# Patient Record
Sex: Female | Born: 1970 | Race: Black or African American | Hispanic: No | Marital: Single | Smoking: Former smoker
Health system: Southern US, Community
[De-identification: ages and names within clinical notes are randomized; demographics above are authoritative.]

## PROBLEM LIST (undated history)

## (undated) DIAGNOSIS — B9689 Other specified bacterial agents as the cause of diseases classified elsewhere: Secondary | ICD-10-CM

## (undated) DIAGNOSIS — R198 Other specified symptoms and signs involving the digestive system and abdomen: Secondary | ICD-10-CM

## (undated) DIAGNOSIS — N898 Other specified noninflammatory disorders of vagina: Principal | ICD-10-CM

## (undated) DIAGNOSIS — N760 Acute vaginitis: Secondary | ICD-10-CM

## (undated) DIAGNOSIS — N816 Rectocele: Secondary | ICD-10-CM

## (undated) HISTORY — DX: Other specified symptoms and signs involving the digestive system and abdomen: R19.8

## (undated) HISTORY — PX: ABDOMINAL HYSTERECTOMY: SHX81

## (undated) HISTORY — DX: Acute vaginitis: N76.0

## (undated) HISTORY — DX: Other specified noninflammatory disorders of vagina: N89.8

## (undated) HISTORY — DX: Rectocele: N81.6

## (undated) HISTORY — PX: TUBAL LIGATION: SHX77

## (undated) HISTORY — DX: Other specified bacterial agents as the cause of diseases classified elsewhere: B96.89

---

## 2001-12-12 ENCOUNTER — Emergency Department (HOSPITAL_COMMUNITY): Admission: EM | Admit: 2001-12-12 | Discharge: 2001-12-13 | Payer: Self-pay | Admitting: Emergency Medicine

## 2002-03-01 ENCOUNTER — Other Ambulatory Visit: Admission: RE | Admit: 2002-03-01 | Discharge: 2002-03-01 | Payer: Self-pay | Admitting: Obstetrics & Gynecology

## 2002-03-24 ENCOUNTER — Emergency Department (HOSPITAL_COMMUNITY): Admission: EM | Admit: 2002-03-24 | Discharge: 2002-03-24 | Payer: Self-pay | Admitting: Internal Medicine

## 2003-01-28 ENCOUNTER — Emergency Department (HOSPITAL_COMMUNITY): Admission: EM | Admit: 2003-01-28 | Discharge: 2003-01-29 | Payer: Self-pay | Admitting: Emergency Medicine

## 2003-01-29 ENCOUNTER — Encounter: Payer: Self-pay | Admitting: *Deleted

## 2004-04-30 ENCOUNTER — Ambulatory Visit (HOSPITAL_COMMUNITY): Admission: RE | Admit: 2004-04-30 | Discharge: 2004-04-30 | Payer: Self-pay | Admitting: Pediatrics

## 2004-05-12 ENCOUNTER — Emergency Department (HOSPITAL_COMMUNITY): Admission: EM | Admit: 2004-05-12 | Discharge: 2004-05-13 | Payer: Self-pay | Admitting: Emergency Medicine

## 2004-06-12 ENCOUNTER — Inpatient Hospital Stay (HOSPITAL_COMMUNITY): Admission: RE | Admit: 2004-06-12 | Discharge: 2004-06-14 | Payer: Self-pay | Admitting: Obstetrics & Gynecology

## 2004-07-13 ENCOUNTER — Inpatient Hospital Stay (HOSPITAL_COMMUNITY): Admission: AD | Admit: 2004-07-13 | Discharge: 2004-07-17 | Payer: Self-pay | Admitting: Obstetrics and Gynecology

## 2004-08-15 ENCOUNTER — Ambulatory Visit (HOSPITAL_COMMUNITY): Admission: RE | Admit: 2004-08-15 | Discharge: 2004-08-15 | Payer: Self-pay | Admitting: Obstetrics & Gynecology

## 2005-02-27 ENCOUNTER — Ambulatory Visit (HOSPITAL_COMMUNITY): Admission: RE | Admit: 2005-02-27 | Discharge: 2005-02-27 | Payer: Self-pay | Admitting: Family Medicine

## 2005-03-13 ENCOUNTER — Ambulatory Visit (HOSPITAL_COMMUNITY): Admission: RE | Admit: 2005-03-13 | Discharge: 2005-03-13 | Payer: Self-pay | Admitting: Family Medicine

## 2005-04-06 ENCOUNTER — Emergency Department (HOSPITAL_COMMUNITY): Admission: EM | Admit: 2005-04-06 | Discharge: 2005-04-07 | Payer: Self-pay | Admitting: Emergency Medicine

## 2006-08-18 ENCOUNTER — Ambulatory Visit (HOSPITAL_COMMUNITY): Admission: RE | Admit: 2006-08-18 | Discharge: 2006-08-18 | Payer: Self-pay | Admitting: Obstetrics and Gynecology

## 2008-08-05 ENCOUNTER — Ambulatory Visit (HOSPITAL_COMMUNITY): Admission: RE | Admit: 2008-08-05 | Discharge: 2008-08-05 | Payer: Self-pay | Admitting: Family Medicine

## 2008-10-11 ENCOUNTER — Emergency Department (HOSPITAL_COMMUNITY): Admission: EM | Admit: 2008-10-11 | Discharge: 2008-10-12 | Payer: Self-pay | Admitting: Emergency Medicine

## 2010-08-06 ENCOUNTER — Emergency Department (HOSPITAL_COMMUNITY): Admission: EM | Admit: 2010-08-06 | Discharge: 2010-08-06 | Payer: Self-pay | Admitting: Emergency Medicine

## 2011-01-25 ENCOUNTER — Emergency Department (HOSPITAL_COMMUNITY): Payer: Self-pay

## 2011-01-25 ENCOUNTER — Emergency Department (HOSPITAL_COMMUNITY)
Admission: EM | Admit: 2011-01-25 | Discharge: 2011-01-25 | Disposition: A | Discharge status: Home / Self Care | Payer: Self-pay | Attending: Emergency Medicine | Admitting: Emergency Medicine

## 2011-01-25 DIAGNOSIS — R0789 Other chest pain: Secondary | ICD-10-CM | POA: Insufficient documentation

## 2011-01-25 DIAGNOSIS — R059 Cough, unspecified: Secondary | ICD-10-CM | POA: Insufficient documentation

## 2011-01-25 DIAGNOSIS — R0602 Shortness of breath: Secondary | ICD-10-CM | POA: Insufficient documentation

## 2011-01-25 DIAGNOSIS — R05 Cough: Secondary | ICD-10-CM | POA: Insufficient documentation

## 2011-01-25 DIAGNOSIS — J45909 Unspecified asthma, uncomplicated: Secondary | ICD-10-CM | POA: Insufficient documentation

## 2011-01-25 DIAGNOSIS — K219 Gastro-esophageal reflux disease without esophagitis: Secondary | ICD-10-CM | POA: Insufficient documentation

## 2011-04-26 NOTE — Discharge Summary (Signed)
NAME:  Stacy Holder, Stacy Holder                       ACCOUNT NO.:  [id number]   MEDICAL RECORD NO.:  [id number]                   PATIENT TYPE:  INP   LOCATION:  A419                                 FACILITY:  [facility]   PHYSICIAN:  Tilda Burrow, M.D.              DATE OF BIRTH:  Jul 05, 1971   DATE OF ADMISSION:  07/13/2004  DATE OF DISCHARGE:  07/17/2004                                 DISCHARGE SUMMARY   ADMISSION DIAGNOSES:  Pelvic pain, pelvic fullness, abscess versus ovarian  cyst.   DISCHARGE DIAGNOSES:  Entrapped ovary with pelvic adhesions, pelvic pain.   PROCEDURE:  Laparoscopic bilateral salpingo-oophorectomy.   SURGEON:  Tilda Burrow, M.D.   ASSISTANT:  ___________.   FINDINGS:  Extensive adhesions of the left side wall, left ovarian trapped  at the vaginal cuff and beside sigmoid, small bowel attached to right round  ligament.   DETAILS OF THE PROCEDURE:  The patient was admitted for pain management on  July 13, 2004 as described in the HPI. She had debilitating pain and a mass  effect in the cul-de-sac. Sed rate was completely normal at 4, white count  10,400, 69 neutrophils, no bands. The patient was admitted for pain  management and assessment.   The patient was admitted, received IV analgesics, and had pelvic ultrasound  performed. We discussed options of surgery, ovarian suppression, and  continued observation. The patient was aware that left salpingo-oophorectomy  was likely and right salpingo-oophorectomy a possibility. The patient had  had bowel prep performed, and surgery was scheduled for July 15, 2004.  Laparoscopic bilateral salpingo-oophorectomy was performed. The procedure  was notable for cuff separation during manipulation of the vaginal cuff that  did not require resuturing. There was 25 cc of blood loss. Findings included  small bowel attached to the right round ligament coagulation site and then  extensive adhesions to left side wall and left  ovary trapped behind the  vaginal cuff and side the sigmoid. The right tube and ovary were  erythematous, edematous, and with enough adhesions that we felt the chances  of return surgery were very, very, very high and therefore as discussed  preoperatively both tubes and ovaries were removed. Postoperatively, the  patient was kept overnight for IV analgesia and was discharged home.  Pathology report returned showing thickened structures but essentially no  other pathology noted.   ADDENDUM:  Postoperatively, the patient remained somewhat tearful and  depressed due to her concern over loosing both tubes and ovaries. We began  hormone replacement therapy and will see her in followup. She will require  management of her hormones as well as potential for depression.    ___________________________________________                                         Tilda Burrow, M.D.  JVF/MEDQ  D:  08/02/2004  T:  08/03/2004  Job:  [id number]

## 2011-04-26 NOTE — Op Note (Signed)
NAME:  Stacy Holder, Stacy Holder                       ACCOUNT NO.:  [id number]   MEDICAL RECORD NO.:  [id number]                   PATIENT TYPE:  AMB   LOCATION:  DAY                                  FACILITY:  [facility]   PHYSICIAN:  Lazaro Arms, M.D.                DATE OF BIRTH:  12/23/1970   DATE OF PROCEDURE:  06/12/2004  DATE OF DISCHARGE:                                 OPERATIVE REPORT   PREOPERATIVE DIAGNOSES:  1. Menometrorrhagia, severe.  2. Dysmenorrhea, severe.  3. Unresponsive to conservative measures.  4. Enlarged uterus consistent with fibroids, 10 weeks size.   POSTOPERATIVE DIAGNOSES:  1. Menometrorrhagia, severe.  2. Dysmenorrhea, severe.  3. Unresponsive to conservative measures.  4. Enlarged uterus consistent with fibroids, 10 weeks size.   OPERATION/PROCEDURE:  Abdominal hysterectomy.   SURGEON:  Lazaro Arms, M.D.   ANESTHESIA:  General endotracheal anesthesia.   FINDINGS:  The patient had a globally large uterus, not sure she had any  fibroids per se, probably adenomyosis.  The ovaries were normally intact.  There was no endometriosis.  There was no adhesive disease.  The pelvis was  entirely normal.   DESCRIPTION OF PROCEDURE:  The patient was taken to the operating room and  placed in the supine position where she underwent general endotracheal  anesthesia.  The vagina was prepped.  The patient Foley catheter was placed  and it was prepped and draped in the usual sterile fashion.  A Pfannenstiel  incision was made and carried down sharply to rectus fascia which was scored  in the midline and extended laterally.  The fascia was taken off the muscle  superiorly and entered without difficulty.  The muscles were divided.  The  abdominal cavity was entered.  A medium-size protractor was placed as a self-  retaining retractor.  The upper abdomen was packed away using moist lap  pads.  The fundus of the uterus was grasped with a thyroid tenaculum.  The  curved  clamped gyrus was then used for coagulation.  The utero-ovarian  ligaments were taken down bilaterally.  The round ligaments were taken down  bilaterally and cut.  The uterine vessels were taken down after the  vesicouterine serosa was created.  Uterine vessels were cut, clamped and  coagulated, and then cut bilaterally with good hemostasis.  The bladder was  placed well out of the way.  Dissection below the cervix.  The cardinal  ligament was taken down, was coagulated with the gyrus on both sides and  cut.  The vaginal angle on the left was clamped and the vagina was opened  and took the vagina anterior and posteriorly separately using the gyrus.  The vaginal angle sutures were placed bilaterally and the vagina was closed  with interrupted figure-of-eight sutures.  The specimen was sent to  pathology.  Blood loss for the procedure was 100 mL.  Vigorous pelvic  irrigation was performed.  All pedicles were found to be hemostatic.  Interseed was placed in the pelvis to prevent adhesions of small bowel,  large bowel and ovaries over the area of dissection and suture.  The  protractor was removed.  The packs were removed.  There was good hemostasis  again.  The muscles reapproximated loosely.  The fascia closed 0 Vicryl  running.  Subcutaneous tissue was made hemostatic and irrigated.  The skin  was closed with a 3-0 Vicryl in a subcuticular fashion using a Mellody Dance needle.  Dermabond was then placed over top.  The patient had requested no staples.  The patient was awakened from anesthesia and taken to the recovery room in  stable condition.      ___________________________________________                                            Lazaro Arms, M.D.   [name]  D:  06/12/2004  T:  06/12/2004  Job:  [id number]

## 2011-04-26 NOTE — Op Note (Signed)
NAME:  Stacy Holder, Stacy Holder                       ACCOUNT NO.:  [id number]   MEDICAL RECORD NO.:  [id number]                   PATIENT TYPE:  INP   LOCATION:  A419                                 FACILITY:  [facility]   PHYSICIAN:  Tilda Burrow, M.D.              DATE OF BIRTH:  [birth year]   DATE OF PROCEDURE:  DATE OF DISCHARGE:                                 OPERATIVE REPORT   PREOPERATIVE DIAGNOSIS:  Pelvic pain.   POSTOPERATIVE DIAGNOSIS:  Pelvic pain.  Multiple pelvic adhesions and  trapped left ovary.   PROCEDURE:  Laparoscopic bilateral salpingo-oophorectomies.   SURGEON:  Tilda Burrow, M.D.   ASSISTANTAmie Critchley, CST.   ANESTHESIA:  Julious Oka, CRNA.   COMPLICATIONS:  Spontaneous partial separation of the vaginal cuff during  vaginal cuff manipulation, not requiring suture repair.   INDICATIONS:  A 40 year old female one month status post hysterectomy, using  Gyrus bipolar cautery technique, returning for pelvic discomfort in the left  side noted significantly since the surgery with ultrasound confirmation of  right ovarian cyst and left ovary just above the vaginal cuff.   PROCEDURE:  Patient was taken to the operating room, prepped and draped in  the usual fashion for a combined abdominal vaginal procedure.  The Foley  catheter was placed.  Speculum exam of the vagina performed.  A single-tooth  tenaculum used to manipulate the vaginal cuff.  We simply rubbed the vaginal  apex with the single-tooth tenaculum in an effort to identify the cuff area  of thickness.  The cuff partially separated spontaneously in response to the  manipulation, and there was a small portion of necrotic tissue that could be  shelled out from the open cuff area.  We placed the cuff on the back portion  of the vaginal cuff.  We then proceeded with laparoscopic procedure.  An  infraumbilical 1 cm skin incision was made as well as a transverse  suprapubic 1.5 cm incision.  The Veress  needle was easily used to achieve  pneumoperitoneum, laparoscopic trocar introduced, a 10 mm trocar, and then  we identified the abdominal cavity with photo #1 showing the initial  abdominal findings with sigmoid colon and epiploic fat attachments to the  left half of the anterior pelvic brim.  On the right side, photo #2 shows  small bowel attachments to the tip of the right round ligament.  Photo #3  shows the separation of that attachment.  Photo #4 shows the steady peeling  of the sigmoid colon off of the pelvic brim with visibility down to the  vaginal cuff.  We then proceeded with continued dissection using  laparoscopic peanuts and grasper to use primarily as a blunt manipulator  except to grasp the pedicles that were safe.  We were able on photo #5 to  grasp the left tube, which was firmly adherent to the lateral side of the  sigmoid colon, and we were  able to peel the sigmoid colon off of the bowel  to be able to identify the infundibulopelvic ligament.  We then clamped with  the harmonic scalpel across the left infundibulopelvic ligament, being  careful to stay away from the side wall, thereby staying out of the area of  the ureter and were usually able to take off the left tube and ovary.  This  was then taken out through the 12 mm suprapubic port by pulling it up to the  port and then morcellating the ovary into an elongated strip.  Attention was  then directed to the right side.  Efforts were made to see if we could free  it up sufficiently.  The ovary could be left, but the under-side of the  ovary was quite abraded by the time it had been freed up and was felt the  potential for recurrent adhesions was extremely high.  We then removed the  right tube and ovary with harmonic scalpel technique without difficulty.  This specimen was also taken through the suprapubic port in a morcellated  strip fashion and sent to the lab for pathology.  Insufflation of the cuff  revealed that  there had been somewhat necrotic tissue from the vaginal cuff  transection.  At the end of the procedure, you could actually see one of the  tips of the single-tooth tenaculum with manipulation, but the opening was  small enough that there was considered no potential for herniation.  There  was no gas or fluid escape noted when the patient coughed.  Throughout the  case, we had checked and had no noticed any gas escape of her vagina either.  Patient then went to the recovery room in good condition after removal of  laparoscopic equipment, was followed by 0 Vicryl closure of the fascia at  the umbilical and suprapubic sites and then 4-0 Dexon closure of all three  puncture sites were performed.  Patient went to the recovery room and  remained stable.  She will be watched overnight.      ___________________________________________                                            Tilda Burrow, M.D.   JVF/MEDQ  D:  07/16/2004  T:  07/16/2004  Job:  [id number]

## 2011-04-26 NOTE — H&P (Signed)
NAME:  Stacy Holder, Stacy Holder                       ACCOUNT NO.:  [id number]   MEDICAL RECORD NO.:  [id number]                   PATIENT TYPE:  INP   LOCATION:  A419                                 FACILITY:  [facility]   PHYSICIAN:  Tilda Burrow, M.D.              DATE OF BIRTH:  1971-04-04   DATE OF ADMISSION:  07/13/2004  DATE OF DISCHARGE:                                HISTORY & PHYSICAL   ADMISSION DIAGNOSES:  1. Pelvic pain.  2. Pelvic fullness, abscess versus ovarian cyst.   HISTORY OF PRESENT ILLNESS:  This 40 year old female now is one month status  post hysterectomy for severe menometrorrhagia is readmitted for pain  management and assessment of fullness and mass effect of the vaginal cuff.  She had been seen in our office intermittently since surgery and has  complained of persistent discomfort that has actually gotten worse recently.  Examination in our office on July 13, 2004, for the second time in a week,  showed the patient to have severe debilitating pain due to mass effect in  the cul-de-sac, felt to be abscess or ovarian cyst.  The pain moved over  into the left on bimanual examination, so making the possibility of a left  ovarian attachments to the vaginal cuff highly likely.  The patient is  unable to tolerate the pain without continuous analgesic use, and is  describing as comfortable for the first time in quite some time when on IV  analgesics.   Laboratory data on admission showed a white count 10,400, with 69  neutrophils, no bands.  Sedimentation rate is completely normal at 4.  The  patient's pain is sufficient to cause nausea and require IV analgesics and  antiemetics.   PAST MEDICAL HISTORY:  Benign.   GYNECOLOGICAL HISTORY:  Severe menometrorrhagia causing the patient to miss  three to four days of work each month with the discomfort of menses.  She  did not respond to medications, decided to proceed with surgery.   PAST SURGICAL HISTORY:  1.  Laparoscopic tubal ligation years ago.  2. Vaginal delivery x2.   PHYSICAL EXAMINATION:  GENERAL:  A tearful, animated, slim, African-American  female.  Alert, active, moderately uncomfortable African-American female.  VITAL SIGNS:  Height is 5 feet 10 inches, weight 130 pounds.  HEENT:  Pupils equal, round, reactive to light.  Extraocular movements were  intact.  NECK:  Supple.  Trachea midline.  CHEST:  Clear to auscultation.  ABDOMEN:  Well-healed Pfannenstiel incision with only slight thickening of  the scar felt tolerable to the patient.  PELVIC:  External genitalia:  Normal female.  Vaginal examination:  Normal  secretions.  Vaginal length adequate.  Vaginal cuff significantly tender and  exquisitely tender to contact causing the pain to go upward and to the left.   LABORATORY DATA:  CT scan of the abdomen shows a cystic structure with left  tube and ovary and right tube  and ovary even with each other at the level  just above the vaginal cuff.  Interestingly, the patient does not have  significant pain on the right at this time, and the cyst is on the right.   IMPRESSION:  Pelvic pain, postoperatively, suspected vaginal cuff adhesions  to the left adnexal structures.   PLAN:  Since admission, we have had lengthy discussions with the patient  regarding treatment options.  We have made it very specifically clear to her  that the likelihood of adhesions to the left tube and ovary are quite high.  It was recommended that we be prepared for likely removal of left tube and  ovary.  The right tube and ovary will be inspected.  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  If,  however, preservation of tube and ovary can reasonably expected to be  successful without chronic pelvic discomfort, we will  attempt to preserve  the right tube and ovary.  Additionally, the patient is comfortable with the  possibility of the need to convert to a laparotomy procedure through the old  hysterectomy scar.     ___________________________________________                                         Tilda Burrow, M.D.   JVF/MEDQ  D:  07/15/2004  T:  07/15/2004  Job:  [id number]   cc:   Tamela Oddi

## 2011-09-10 LAB — CBC
HCT: 41.7
Hemoglobin: 14.1
MCHC: 33.8
Platelets: 236
RBC: 4.56

## 2011-09-10 LAB — POCT CARDIAC MARKERS
CKMB, poc: 1
Myoglobin, poc: 52.6
Troponin i, poc: <0.05
Troponin i, poc: <0.05

## 2011-09-10 LAB — D-DIMER, QUANTITATIVE: D-Dimer, Quant: 0.37

## 2011-09-10 LAB — DIFFERENTIAL
Basophils Absolute: 0
Eosinophils Absolute: 0.1
Lymphs Abs: 5 — ABNORMAL HIGH
Neutrophils Relative %: 50

## 2011-09-10 LAB — BASIC METABOLIC PANEL
CO2: 29
GFR calc Af Amer: >60
GFR calc non Af Amer: >60

## 2013-07-22 ENCOUNTER — Emergency Department (HOSPITAL_COMMUNITY)
Admission: EM | Admit: 2013-07-22 | Discharge: 2013-07-22 | Disposition: A | Discharge status: Home / Self Care | Payer: Self-pay | Attending: Emergency Medicine | Admitting: Emergency Medicine

## 2013-07-22 ENCOUNTER — Encounter (HOSPITAL_COMMUNITY): Payer: Self-pay | Admitting: Emergency Medicine

## 2013-07-22 DIAGNOSIS — H9190 Unspecified hearing loss, unspecified ear: Secondary | ICD-10-CM | POA: Insufficient documentation

## 2013-07-22 DIAGNOSIS — H6690 Otitis media, unspecified, unspecified ear: Secondary | ICD-10-CM | POA: Insufficient documentation

## 2013-07-22 DIAGNOSIS — H9210 Otorrhea, unspecified ear: Secondary | ICD-10-CM | POA: Insufficient documentation

## 2013-07-22 DIAGNOSIS — F17200 Nicotine dependence, unspecified, uncomplicated: Secondary | ICD-10-CM | POA: Insufficient documentation

## 2013-07-22 DIAGNOSIS — H6120 Impacted cerumen, unspecified ear: Secondary | ICD-10-CM | POA: Insufficient documentation

## 2013-07-22 DIAGNOSIS — H6121 Impacted cerumen, right ear: Secondary | ICD-10-CM

## 2013-07-22 DIAGNOSIS — H6691 Otitis media, unspecified, right ear: Secondary | ICD-10-CM

## 2013-07-22 DIAGNOSIS — Z91040 Latex allergy status: Secondary | ICD-10-CM | POA: Insufficient documentation

## 2013-07-22 MED ORDER — AMOXICILLIN 500 MG PO CAPS: 500.0000 mg | ORAL_CAPSULE | Freq: Three times a day (TID) | ORAL | Status: DC

## 2013-07-22 MED ORDER — ANTIPYRINE-BENZOCAINE 5.4-1.4 % OT SOLN
3.0000 [drp] | Freq: Once | OTIC | Status: AC
  Administered 2013-07-22: 4 [drp] via OTIC
  Filled 2013-07-22: qty 10

## 2013-07-22 MED ORDER — HYDROCODONE-ACETAMINOPHEN 5-325 MG PO TABS
1.0000 | ORAL_TABLET | Freq: Once | ORAL | Status: AC
  Administered 2013-07-22: 1 via ORAL
  Filled 2013-07-22: qty 1

## 2013-07-22 NOTE — ED Notes (Addendum)
Patient c/o right ear pain with yellowish, brown drainage. Patient reports using neomycin/ polymycin drops with no relief. 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 Patient tearful in pain. Patient now reports sore throat.

## 2013-07-22 NOTE — Progress Notes (Signed)
ED/CM noted pt did not have health insurance, and/or PCP. Patient was given the ED [place] uninsured handout with information for the clinics, food pantries, and the handout for insurance sign-up. Patient expressed appreciation for this.      

## 2013-07-22 NOTE — ED Notes (Signed)
Lt ear pain, crying,  Irrigated with removal of wax .  Pt says she feels better after irrigation.

## 2013-07-23 NOTE — ED Provider Notes (Signed)
CSN: [id number]     Arrival date & time 07/22/13  1233 History     First MD Initiated Contact with Patient 07/22/13 1251     Chief Complaint  Patient presents with  . Otalgia   (Consider location/radiation/quality/duration/timing/severity/associated sxs/prior Treatment) Patient is a 42 y.o. female presenting with ear pain. The history is provided by the patient.  Otalgia Location:  Right Behind ear:  No abnormality Quality:  Aching and throbbing Severity:  Severe Onset quality:  Sudden Duration:  5 hours Timing:  Constant Progression:  Unchanged Chronicity:  Recurrent Context: water   Context: not direct blow, not elevation change, not foreign body in ear and not loud noise   Relieved by:  Nothing Worsened by:  Nothing tried Ineffective treatments: cortisporin drops. 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 No pertinent past medical history. Past Surgical History  Procedure Laterality Date  . Abdominal hysterectomy     History reviewed. No pertinent family history. History  Substance Use Topics  . Smoking status: Current Every Day Smoker -- 0.04 packs/day for 15 years    Types: Cigarettes  . Smokeless tobacco: Never Used  . Alcohol Use: No   OB History   Grav Para Term Preterm Abortions TAB SAB Ect Mult Living                 Review of Systems  Constitutional: Negative for fever, chills, activity change and appetite change.  HENT: Positive for ear pain and ear discharge. Negative for hearing loss, congestion, sore throat, facial swelling, rhinorrhea, trouble swallowing, neck pain, neck stiffness and tinnitus.   Respiratory: Negative for cough.   Gastrointestinal: Negative for vomiting, abdominal pain  and diarrhea.  Skin: Negative for rash.  Neurological: Negative for dizziness, syncope, weakness, numbness and headaches.  All other systems reviewed and are negative.    Allergies  Latex  Home Medications   Current Outpatient Rx  Name  Route  Sig  Dispense  Refill  . Calcium-Phosphorus-Vitamin D (CALCIUM GUMMIES PO)   Oral   Take 1 tablet by mouth daily.         Marland Kitchen estradiol (ESTRACE) 2 MG tablet   Oral   Take 2 mg by mouth daily.         . Multiple Vitamins-Minerals (CENTRUM PO)   Oral   Take 1 tablet by mouth daily.         Marland Kitchen amoxicillin (AMOXIL) 500 MG capsule   Oral   Take 1 capsule (500 mg total) by mouth 3 (three) times daily. For 10 days   30 capsule   0    BP 150/109  Pulse 60  Temp(Src) 98 F (36.7 C) (Oral)  Resp 26  Ht 5\' 3"  (1.6 m)  Wt 163 lb (73.936 kg)  BMI 28.88 kg/m2  SpO2 100% Physical Exam  Nursing note and vitals reviewed. Constitutional: She is oriented to person, place, and time. She appears well-developed and well-nourished. No distress.  HENT:  Head: Normocephalic and atraumatic.  Right Ear: There is drainage and tenderness. No mastoid tenderness. No hemotympanum. Decreased hearing is noted.  Left Ear: Tympanic membrane and ear canal normal.  Mouth/Throat: Oropharynx is clear and moist. No oropharyngeal exudate.  Cerumen impaction right ear canal.  TM not visualized due to the cerumen  Neck: Normal range of motion. Neck supple.  Cardiovascular: Normal rate, regular rhythm, normal heart sounds and intact distal pulses.   No murmur heard. Pulmonary/Chest: Effort normal and breath sounds normal. No respiratory distress.  Musculoskeletal: Normal range of motion.  Lymphadenopathy:    She has no cervical adenopathy.  Neurological: She is alert and oriented to person, place, and time. She exhibits normal muscle tone. Coordination normal.  Skin: Skin is warm and dry.    ED Course   Procedures (including critical care time)  Labs  Reviewed - No data to display No results found. 1. Cerumen impaction, right   2. Otitis media, right     MDM   Significant amt of cerumen irrigated from the right ear canal by the nursing staff using warm saline.  Pain improved, hearing also improved.  VSS.  Pt ambulates with a steady gait, appears stable for d/c  On recheck after irrigation the TM was visualized and TM was slightly erythematous w/o perforation.  Auralgan otic dispensed, prescribed amoxil and she agrees to f/u with her PMD  Haifa Hatton L. Daliya Parchment, PA-C 07/23/13 2050

## 2013-07-25 NOTE — ED Provider Notes (Signed)
Medical screening examination/treatment/procedure(s) were performed by non-physician practitioner and as supervising physician I was immediately available for consultation/collaboration.    Isaak Delmundo D Jaculin Rasmus, MD 07/25/13 0233 

## 2014-05-03 ENCOUNTER — Ambulatory Visit: Payer: Managed Care, Other (non HMO) | Admitting: Orthopedic Surgery

## 2014-07-04 ENCOUNTER — Emergency Department (HOSPITAL_COMMUNITY)
Admission: EM | Admit: 2014-07-04 | Discharge: 2014-07-04 | Disposition: A | Discharge status: Home / Self Care | Payer: Managed Care, Other (non HMO) | Attending: Emergency Medicine | Admitting: Emergency Medicine

## 2014-07-04 ENCOUNTER — Encounter (HOSPITAL_COMMUNITY): Payer: Self-pay | Admitting: Emergency Medicine

## 2014-07-04 DIAGNOSIS — Z792 Long term (current) use of antibiotics: Secondary | ICD-10-CM | POA: Insufficient documentation

## 2014-07-04 DIAGNOSIS — K089 Disorder of teeth and supporting structures, unspecified: Secondary | ICD-10-CM | POA: Insufficient documentation

## 2014-07-04 DIAGNOSIS — K0889 Other specified disorders of teeth and supporting structures: Secondary | ICD-10-CM

## 2014-07-04 DIAGNOSIS — Z79899 Other long term (current) drug therapy: Secondary | ICD-10-CM | POA: Insufficient documentation

## 2014-07-04 DIAGNOSIS — F17200 Nicotine dependence, unspecified, uncomplicated: Secondary | ICD-10-CM | POA: Insufficient documentation

## 2014-07-04 DIAGNOSIS — K029 Dental caries, unspecified: Secondary | ICD-10-CM | POA: Insufficient documentation

## 2014-07-04 DIAGNOSIS — Z91040 Latex allergy status: Secondary | ICD-10-CM | POA: Insufficient documentation

## 2014-07-04 MED ORDER — CLINDAMYCIN HCL 150 MG PO CAPS
300.0000 mg | ORAL_CAPSULE | Freq: Once | ORAL | Status: AC
  Administered 2014-07-04: 300 mg via ORAL
  Filled 2014-07-04: qty 2

## 2014-07-04 MED ORDER — CLINDAMYCIN HCL 300 MG PO CAPS: 300.0000 mg | ORAL_CAPSULE | Freq: Four times a day (QID) | ORAL | Status: DC

## 2014-07-04 MED ORDER — HYDROCODONE-ACETAMINOPHEN 5-325 MG PO TABS: ORAL_TABLET | ORAL | Status: DC

## 2014-07-04 NOTE — ED Notes (Signed)
Pt states dental pain. Pt states a tooth has broken off and she has swelling to her gum with facial swelling to the left side of face.

## 2014-07-04 NOTE — ED Notes (Signed)
Pain lt upper molar for 2 mos, worse since yesterday when piece of tooth broke off. Says her face was swollen this am.

## 2014-07-04 NOTE — ED Provider Notes (Signed)
CSN: [id number]     Arrival date & time 07/04/14  1618 History   None   This chart was scribed for non-physician practitioner Pauline Ausammy Samyuktha Brau, PA-C working with Glynn OctaveStephen Rancour, MD, by Andrew Auaven Small, ED Scribe. This patient was seen in room APFT20/APFT20 and the patient's care was started at 5:04 PM. Chief Complaint  Patient presents with  . Dental Pain   Patient is a 43 y.o. female presenting with tooth pain. The history is provided by the patient. No language interpreter was used.  Dental Pain Location:  Upper Upper teeth location:  14/LU 1st molar Quality:  Aching Severity:  Moderate Context: filling fell out   Associated symptoms: facial swelling   Associated symptoms: no congestion, no fever, no headaches and no neck pain     Stacy Holder is a 43 y.o. female who presents to the Emergency Department complaining of worsening achy left upper first molar pain with associated left sided facial swelling and gum swelling. Pt reports the filling came out her tooth a while ago but then broke her tooth yesterday. Pt has been using oral gel, taking ibuprofen and ambesol.  Pt reports she wont have insurance for another 2 weeks but does have a dentist. She denies fever, chills, vomiting   History reviewed. No pertinent past medical history. Past Surgical History  Procedure Laterality Date  . Abdominal hysterectomy     No family history on file. History  Substance Use Topics  . Smoking status: Current Every Day Smoker -- 0.04 packs/day for 15 years    Types: Cigarettes  . Smokeless tobacco: Never Used  . Alcohol Use: No   OB History   Grav Para Term Preterm Abortions TAB SAB Ect Mult Living                 Review of Systems  Constitutional: Negative for fever, chills and appetite change.  HENT: Positive for dental problem and facial swelling. Negative for congestion, sore throat and trouble swallowing.   Eyes: Negative for pain and visual disturbance.  Musculoskeletal: Negative for  neck pain and neck stiffness.  Neurological: Negative for dizziness, facial asymmetry and headaches.  Hematological: Negative for adenopathy.  All other systems reviewed and are negative.     Allergies  Latex  Home Medications   Prior to Admission medications   Medication Sig Start Date End Date Taking? Authorizing Provider  amoxicillin (AMOXIL) 500 MG capsule Take 1 capsule (500 mg total) by mouth 3 (three) times daily. For 10 days 07/22/13   Elizabelle Fite L. Nemesio Castrillon, PA-C  Calcium-Phosphorus-Vitamin D (CALCIUM GUMMIES PO) Take 1 tablet by mouth daily.    Historical Provider, MD  estradiol (ESTRACE) 2 MG tablet Take 2 mg by mouth daily.    Historical Provider, MD  Multiple Vitamins-Minerals (CENTRUM PO) Take 1 tablet by mouth daily.    Historical Provider, MD   BP 157/87  Pulse 65  Temp(Src) 99 F (37.2 C) (Oral)  Resp 16  Ht 5\' 3"  (1.6 m)  Wt 166 lb (75.297 kg)  BMI 29.41 kg/m2  SpO2 100% Physical Exam  Nursing note and vitals reviewed. Constitutional: She is oriented to person, place, and time. She appears well-developed and well-nourished. No distress.  HENT:  Head: Normocephalic and atraumatic.  Right Ear: Tympanic membrane and ear canal normal.  Left Ear: Tympanic membrane and ear canal normal.  Mouth/Throat: Uvula is midline, oropharynx is clear and moist and mucous membranes are normal. No trismus in the jaw. Dental caries present. No dental  abscesses or uvula swelling.    Left upper first molar- TTP along the gum. Mild  facial swelling along the left nasolabial fold  Eyes: Conjunctivae and EOM are normal.  Neck: Normal range of motion. Neck supple.  Cardiovascular: Normal rate, regular rhythm and normal heart sounds.  Exam reveals no gallop and no friction rub.   No murmur heard. Pulmonary/Chest: Effort normal and breath sounds normal. No respiratory distress. She has no wheezes. She has no rales. She exhibits no tenderness.  Musculoskeletal: Normal range of motion.   Lymphadenopathy:    She has no cervical adenopathy.  Neurological: She is alert and oriented to person, place, and time. She exhibits normal muscle tone. Coordination normal.  Skin: Skin is warm and dry.  Psychiatric: She has a normal mood and affect. Her behavior is normal.    ED Course  Procedures (including critical care time) DIAGNOSTIC STUDIES: Oxygen Saturation is 100% on RA, normal by my interpretation.    COORDINATION OF CARE: 5:39 PM- Pt advised of plan for treatment which includes 10 day clindamycin, vicodin pt agrees.  Labs Review Labs Reviewed - No data to display  Imaging Review No results found.   EKG Interpretation None      MDM   Final diagnoses:  Pain, dental    Pt is well appearing.  No concerning sx's for infection to deep structures of the neck or floor of the mouth.      I personally performed the services described in this documentation, which was scribed in my presence. The recorded information has been reviewed and is accurate.      Carlisle Torgeson L. Trisha Mangle, PA-C 07/06/14 1712

## 2014-07-04 NOTE — Discharge Instructions (Signed)

## 2014-07-06 NOTE — ED Provider Notes (Signed)
Medical screening examination/treatment/procedure(s) were performed by non-physician practitioner and as supervising physician I was immediately available for consultation/collaboration.   EKG Interpretation None        Yoshiaki Kreuser, MD 07/06/14 1908 

## 2014-08-14 ENCOUNTER — Emergency Department (HOSPITAL_COMMUNITY)
Admission: EM | Admit: 2014-08-14 | Discharge: 2014-08-14 | Disposition: A | Discharge status: Home / Self Care | Payer: Managed Care, Other (non HMO) | Attending: Emergency Medicine | Admitting: Emergency Medicine

## 2014-08-14 ENCOUNTER — Encounter (HOSPITAL_COMMUNITY): Payer: Self-pay | Admitting: Emergency Medicine

## 2014-08-14 DIAGNOSIS — F17200 Nicotine dependence, unspecified, uncomplicated: Secondary | ICD-10-CM | POA: Diagnosis not present

## 2014-08-14 DIAGNOSIS — H6091 Unspecified otitis externa, right ear: Secondary | ICD-10-CM

## 2014-08-14 DIAGNOSIS — H60399 Other infective otitis externa, unspecified ear: Secondary | ICD-10-CM | POA: Diagnosis not present

## 2014-08-14 DIAGNOSIS — H9209 Otalgia, unspecified ear: Secondary | ICD-10-CM | POA: Insufficient documentation

## 2014-08-14 DIAGNOSIS — H6120 Impacted cerumen, unspecified ear: Secondary | ICD-10-CM | POA: Insufficient documentation

## 2014-08-14 DIAGNOSIS — H6121 Impacted cerumen, right ear: Secondary | ICD-10-CM

## 2014-08-14 DIAGNOSIS — Z91040 Latex allergy status: Secondary | ICD-10-CM | POA: Insufficient documentation

## 2014-08-14 DIAGNOSIS — K029 Dental caries, unspecified: Secondary | ICD-10-CM | POA: Diagnosis not present

## 2014-08-14 DIAGNOSIS — K089 Disorder of teeth and supporting structures, unspecified: Secondary | ICD-10-CM | POA: Diagnosis not present

## 2014-08-14 DIAGNOSIS — Z792 Long term (current) use of antibiotics: Secondary | ICD-10-CM | POA: Diagnosis not present

## 2014-08-14 DIAGNOSIS — H9190 Unspecified hearing loss, unspecified ear: Secondary | ICD-10-CM | POA: Insufficient documentation

## 2014-08-14 DIAGNOSIS — K0889 Other specified disorders of teeth and supporting structures: Secondary | ICD-10-CM

## 2014-08-14 MED ORDER — CIPROFLOXACIN-DEXAMETHASONE 0.3-0.1 % OT SUSP
4.0000 [drp] | Freq: Two times a day (BID) | OTIC | Status: DC
Start: 2014-08-14 — End: 2015-11-24

## 2014-08-14 MED ORDER — ANTIPYRINE-BENZOCAINE 5.4-1.4 % OT SOLN
3.0000 [drp] | Freq: Once | OTIC | Status: AC
  Administered 2014-08-14: 4 [drp] via OTIC
  Filled 2014-08-14: qty 10

## 2014-08-14 MED ORDER — PENICILLIN V POTASSIUM 500 MG PO TABS: 500.0000 mg | ORAL_TABLET | Freq: Four times a day (QID) | ORAL | Status: AC

## 2014-08-14 NOTE — Discharge Instructions (Signed)
Dental Pain  Toothache is pain in or around a tooth. It may get worse with chewing or with cold or heat.   HOME CARE  · Your dentist may use a numbing medicine during treatment. If so, you may need to avoid eating until the medicine wears off. Ask your dentist about this.  · Only take medicine as told by your dentist or doctor.  · Avoid chewing food near the painful tooth until after all treatment is done. Ask your dentist about this.  GET HELP RIGHT AWAY IF:   · The problem gets worse or new problems appear.  · You have a fever.  · There is redness and puffiness (swelling) of the face, jaw, or neck.  · You cannot open your mouth.  · There is pain in the jaw.  · There is very bad pain that is not helped by medicine.  MAKE SURE YOU:   · Understand these instructions.  · Will watch your condition.  · Will get help right away if you are not doing well or get worse.  Document Released: 05/13/2008 Document Revised: 02/17/2012 Document Reviewed: 05/13/2008  ExitCare® Patient Information ©2015 [employer]. This information is not intended to replace advice given to you by your health care provider. Make sure you discuss any questions you have with your health care provider.

## 2014-08-14 NOTE — ED Notes (Signed)
Having pain to right ear since last Wednesday.  Having problem with hearing to right ear.

## 2014-08-14 NOTE — ED Provider Notes (Signed)
CSN: [id number]     Arrival date & time 08/14/14  0900 History   First MD Initiated Contact with Patient 08/14/14 [phone]     Chief Complaint  Patient presents with  . Otalgia   Stacy Holder is a 43 y.o. female who presents to the Emergency Department complaining of right ear pain.  She states that she was at the beach last week and accidentally got water in her ear which she believes is the cause of her symptoms. This morning patient reports noticing swelling to the front of her ear.  She also reports recurrent left upper tooth pain.  Has an appt with a dentist in 10 days.  She denies fever, neck pain, difficulty swallowing or breathing.      (Consider location/radiation/quality/duration/timing/severity/associated sxs/prior Treatment) Patient is a 43 y.o. female presenting with ear pain.  Otalgia Location:  Right Behind ear:  No abnormality Quality:  Aching, sharp and shooting Severity:  Moderate Onset quality:  Gradual Duration:  4 days Timing:  Constant Progression:  Worsening Chronicity:  New Context: water   Relieved by:  Nothing Worsened by:  Nothing tried Ineffective treatments:  None tried Associated symptoms: hearing loss   Associated symptoms: no congestion, no cough, no ear discharge, no fever, no headaches, no neck pain, no rash, no rhinorrhea, no sore throat and no vomiting     History reviewed. No pertinent past medical history. Past Surgical History  Procedure Laterality Date  . Abdominal hysterectomy     History reviewed. No pertinent family history. History  Substance Use Topics  . Smoking status: Current Every Day Smoker -- 0.04 packs/day for 15 years    Types: Cigarettes  . Smokeless tobacco: Never Used  . Alcohol Use: No   OB History   Grav Para Term Preterm Abortions TAB SAB Ect Mult Living                 Review of Systems  Constitutional: Negative for fever, chills, activity change and appetite change.  HENT: Positive for dental problem, ear  pain, facial swelling and hearing loss. Negative for congestion, ear discharge, rhinorrhea, sore throat and trouble swallowing.   Eyes: Negative for visual disturbance.  Respiratory: Negative for cough and shortness of breath.   Gastrointestinal: Negative for nausea and vomiting.  Musculoskeletal: Negative for neck pain and neck stiffness.  Skin: Negative.  Negative for rash.  Neurological: Negative for dizziness, speech difficulty, weakness, numbness and headaches.  Hematological: Negative for adenopathy.  Psychiatric/Behavioral: Negative for confusion.  All other systems reviewed and are negative.     Allergies  Latex  Home Medications   Prior to Admission medications   Medication Sig Start Date End Date Taking? Authorizing Provider  clindamycin (CLEOCIN) 300 MG capsule Take 1 capsule (300 mg total) by mouth 4 (four) times daily. 07/04/14   Piotr Christopher L. Adolphe Fortunato, PA-C  HYDROcodone-acetaminophen (NORCO/VICODIN) 5-325 MG per tablet Take one-two tabs po q 4-6 hrs prn pain 07/04/14   Anelle Parlow L. Tondalaya Perren, PA-C  ibuprofen (ADVIL,MOTRIN) 200 MG tablet Take 800 mg by mouth every 6 (six) hours as needed for mild pain or moderate pain.    Historical Provider, MD   BP 144/97  Pulse 60  Temp(Src) 97.7 F (36.5 C) (Oral)  Resp 20  Ht  (1.6 m)  Wt 165 lb (74.844 kg)  BMI 29.24 kg/m2  SpO2 100% Physical Exam  Nursing note and vitals reviewed. Constitutional: She is oriented to person, place, and time. She appears well-developed and well-nourished.  No distress.  HENT:  Head: Normocephalic and atraumatic.  Right Ear: There is tenderness. No swelling. No mastoid tenderness. No hemotympanum.  Left Ear: Tympanic membrane and ear canal normal.  Mouth/Throat: Uvula is midline, oropharynx is clear and moist and mucous membranes are normal. No oral lesions. No trismus in the jaw. Dental caries present. No uvula swelling. No oropharyngeal exudate.    Moderate amt of cerumen tot he right ear canal.   TM is poorly visualized but appears slightly erythematous. Patient also has tenderness to palpation of the left upper first molar without obvious abscess. No trismus or sublingual abnormalities  Neck: Normal range of motion. Neck supple.  Cardiovascular: Normal rate, regular rhythm, normal heart sounds and intact distal pulses.   No murmur heard. Pulmonary/Chest: Effort normal and breath sounds normal. No stridor. No respiratory distress.  Musculoskeletal: Normal range of motion.  Lymphadenopathy:    She has no cervical adenopathy.  Neurological: She is alert and oriented to person, place, and time. Coordination normal.  Skin: Skin is warm and dry. No rash noted.    ED Course  Procedures (including critical care time) Labs Review Labs Reviewed - No data to display  Imaging Review No results found.   EKG Interpretation None      MDM   Final diagnoses:  Otitis externa, right  Cerumen impaction, right  Pain, dental   Right ear canal was irrigated with warm saline, Auralgan drops were applied. Moderate amount of cerumen removed, pain improved, patient is feeling better and agrees to close followup with her primary care physician. Patient also has a dental appointment in 10 days. She agrees to continue Auralgan drops at home, prescription for Pen-Vee K for dental pain and Ciprodex otic drops. Vital signs are stable. She appears stable for discharge.     Costa Jha L. Bryna Razavi, PA-C 08/14/14 1059

## 2014-08-14 NOTE — ED Provider Notes (Signed)
Medical screening examination/treatment/procedure(s) were conducted as a shared visit with non-physician practitioner(s) and myself.  I personally evaluated the patient during the encounter  Please see my separate respective documentation pertaining to this patient encounter   Vida Roller, MD 08/14/14 1454

## 2014-08-14 NOTE — ED Provider Notes (Signed)
R earache after swimming several days ago with muffled hearing - worse with manipulation of ear - on exam has some swelling of the EAC as well as cerumen impaction.  TM not visualized on exam.  L TM normal, L EAC normal, OP clear, no change in phonation, no tongue protrusion or pain, normal MM.  Non toxic appearing, tx with topical abx for ear, dental pain routine care, dental f/u.  Medical screening examination/treatment/procedure(s) were conducted as a shared visit with non-physician practitioner(s) and myself.  I personally evaluated the patient during the encounter.   Encounter Diagnosis:    ICD-9-CM ICD-10-CM  1. Otitis externa, right 380.10 H60.91  2. Cerumen impaction, right 380.4 H61.21  3. Pain, dental 525.9 K08.8              Vida Roller, MD 08/14/14 1454

## 2014-08-23 ENCOUNTER — Other Ambulatory Visit (HOSPITAL_COMMUNITY): Payer: Self-pay | Admitting: Physician Assistant

## 2014-08-23 DIAGNOSIS — Z0000 Encounter for general adult medical examination without abnormal findings: Secondary | ICD-10-CM

## 2014-08-29 ENCOUNTER — Ambulatory Visit (HOSPITAL_COMMUNITY)
Admission: RE | Admit: 2014-08-29 | Discharge: 2014-08-29 | Disposition: A | Discharge status: Home / Self Care | Payer: Managed Care, Other (non HMO) | Source: Ambulatory Visit | Attending: Physician Assistant | Admitting: Physician Assistant

## 2014-08-29 DIAGNOSIS — Z1231 Encounter for screening mammogram for malignant neoplasm of breast: Secondary | ICD-10-CM | POA: Diagnosis present

## 2014-08-29 DIAGNOSIS — Z0000 Encounter for general adult medical examination without abnormal findings: Secondary | ICD-10-CM

## 2015-07-28 ENCOUNTER — Other Ambulatory Visit (HOSPITAL_COMMUNITY): Payer: Self-pay | Admitting: Physician Assistant

## 2015-07-28 DIAGNOSIS — Z1231 Encounter for screening mammogram for malignant neoplasm of breast: Secondary | ICD-10-CM

## 2015-07-31 ENCOUNTER — Ambulatory Visit (HOSPITAL_COMMUNITY)
Admission: RE | Admit: 2015-07-31 | Discharge: 2015-07-31 | Disposition: A | Discharge status: Home / Self Care | Payer: Managed Care, Other (non HMO) | Source: Ambulatory Visit | Attending: Physician Assistant | Admitting: Physician Assistant

## 2015-07-31 DIAGNOSIS — Z1231 Encounter for screening mammogram for malignant neoplasm of breast: Secondary | ICD-10-CM | POA: Diagnosis not present

## 2015-09-06 ENCOUNTER — Encounter: Payer: Self-pay | Admitting: Obstetrics & Gynecology

## 2015-09-13 ENCOUNTER — Encounter: Payer: Self-pay | Admitting: Obstetrics & Gynecology

## 2015-09-13 ENCOUNTER — Ambulatory Visit (INDEPENDENT_AMBULATORY_CARE_PROVIDER_SITE_OTHER): Payer: Managed Care, Other (non HMO) | Admitting: Obstetrics & Gynecology

## 2015-09-13 VITALS — BP 120/90 | HR 80 | Ht 63.2 in | Wt 162.0 lb

## 2015-09-13 DIAGNOSIS — Z0000 Encounter for general adult medical examination without abnormal findings: Secondary | ICD-10-CM | POA: Diagnosis not present

## 2015-09-13 MED ORDER — ESTRADIOL 2 MG PO TABS: 2.0000 mg | ORAL_TABLET | Freq: Every day | ORAL | Status: DC

## 2015-09-13 NOTE — Progress Notes (Signed)
Patient ID: Stacy Holder, female   DOB: 09-26-1971, 44 y.o.   MRN: [id number] Subjective:     Stacy Holder is a 44 y.o. female here for a routine exam.  No LMP recorded. Patient has had a hysterectomy. No obstetric history on file. Birth Control Method:  hysterectomy Menstrual Calendar(currently): hysterectomy  Current complaints: hot flashes and dryness.   Current acute medical issues:  none   Recent Gynecologic History No LMP recorded. Patient has had a hysterectomy. Last Pap: years ago, had hyst,   Last mammogram: 2016,  normal  History reviewed. No pertinent past medical history.  Past Surgical History  Procedure Laterality Date  . Abdominal hysterectomy      OB History    No data available      Social History   Social History  . Marital Status: Single    Spouse Name: N/A  . Number of Children: N/A  . Years of Education: N/A   Social History Main Topics  . Smoking status: Current Every Day Smoker -- 0.04 packs/day for 15 years    Types: Cigarettes  . Smokeless tobacco: Never Used  . Alcohol Use: No  . Drug Use: No  . Sexual Activity: Yes    Birth Control/ Protection: Surgical   Other Topics Concern  . None   Social History Narrative    Family History  Problem Relation Age of Onset  . Hypertension Mother   . Kidney failure Father   . Diabetes Father      Current outpatient prescriptions:  .  BLACK COHOSH PO, Take by mouth 2 (two) times daily., Disp: , Rfl:  .  ciprofloxacin-dexamethasone (CIPRODEX) otic suspension, Place 4 drops into the right ear 2 (two) times daily. (Patient not taking: Reported on 09/13/2015), Disp: 7.5 mL, Rfl: 0 .  ibuprofen (ADVIL,MOTRIN) 200 MG tablet, Take 800 mg by mouth every 6 (six) hours as needed for mild pain., Disp: , Rfl:  .  Multiple Vitamin (MULTIVITAMIN WITH MINERALS) TABS tablet, Take 1 tablet by mouth daily., Disp: , Rfl:   Review of Systems  Review of Systems  Constitutional: Negative for fever, chills,  weight loss, malaise/fatigue and diaphoresis.  HENT: Negative for hearing loss, ear pain, nosebleeds, congestion, sore throat, neck pain, tinnitus and ear discharge.   Eyes: Negative for blurred vision, double vision, photophobia, pain, discharge and redness.  Respiratory: Negative for cough, hemoptysis, sputum production, shortness of breath, wheezing and stridor.   Cardiovascular: Negative for chest pain, palpitations, orthopnea, claudication, leg swelling and PND.  Gastrointestinal: negative for abdominal pain. Negative for heartburn, nausea, vomiting, diarrhea, constipation, blood in stool and melena.  Genitourinary: Negative for dysuria, urgency, frequency, hematuria and flank pain.  Musculoskeletal: Negative for myalgias, back pain, joint pain and falls.  Skin: Negative for itching and rash.  Neurological: Negative for dizziness, tingling, tremors, sensory change, speech change, focal weakness, seizures, loss of consciousness, weakness and headaches.  Endo/Heme/Allergies: Negative for environmental allergies and polydipsia. Does not bruise/bleed easily.  Psychiatric/Behavioral: Negative for depression, suicidal ideas, hallucinations, memory loss and substance abuse. The patient is not nervous/anxious and does not have insomnia.        Objective:  Blood pressure 120/90, pulse 80, height 5' 3.2" (1.605 m), weight 162 lb (73.483 kg).   Physical Exam  Vitals reviewed. Constitutional: She is oriented to person, place, and time. She appears well-developed and well-nourished.  HENT:  Head: Normocephalic and atraumatic.        Right Ear: External ear normal.  Left Ear: External ear normal.  Nose: Nose normal.  Mouth/Throat: Oropharynx is clear and moist.  Eyes: Conjunctivae and EOM are normal. Pupils are equal, round, and reactive to light. Right eye exhibits no discharge. Left eye exhibits no discharge. No scleral icterus.  Neck: Normal range of motion. Neck supple. No tracheal deviation  present. No thyromegaly present.  Cardiovascular: Normal rate, regular rhythm, normal heart sounds and intact distal pulses.  Exam reveals no gallop and no friction rub.   No murmur heard. Respiratory: Effort normal and breath sounds normal. No respiratory distress. She has no wheezes. She has no rales. She exhibits no tenderness.  GI: Soft. Bowel sounds are normal. She exhibits no distension and no mass. There is no tenderness. There is no rebound and no guarding.  Genitourinary:  Breasts no masses skin changes or nipple changes bilaterally      Vulva is normal without lesions Vagina is pink moist without discharge Cervix absent Uterus is  Adnexa is negative   Musculoskeletal: Normal range of motion. She exhibits no edema and no tenderness.  Neurological: She is alert and oriented to person, place, and time. She has normal reflexes. She displays normal reflexes. No cranial nerve deficit. She exhibits normal muscle tone. Coordination normal.  Skin: Skin is warm and dry. No rash noted. No erythema. No pallor.  Psychiatric: She has a normal mood and affect. Her behavior is normal. Judgment and thought content normal.       Assessment:    Healthy female exam.    Plan:    Hormone replacement therapy: hormone replacement therapy: estrace . Follow up in: 2 years.    Mammogram 8.26.16 normal

## 2015-11-24 ENCOUNTER — Ambulatory Visit (INDEPENDENT_AMBULATORY_CARE_PROVIDER_SITE_OTHER): Payer: Managed Care, Other (non HMO) | Admitting: Adult Health

## 2015-11-24 ENCOUNTER — Encounter: Payer: Self-pay | Admitting: Adult Health

## 2015-11-24 VITALS — BP 130/72 | HR 78 | Ht 63.5 in | Wt 154.5 lb

## 2015-11-24 DIAGNOSIS — Z1211 Encounter for screening for malignant neoplasm of colon: Secondary | ICD-10-CM | POA: Diagnosis not present

## 2015-11-24 DIAGNOSIS — N898 Other specified noninflammatory disorders of vagina: Secondary | ICD-10-CM | POA: Diagnosis not present

## 2015-11-24 DIAGNOSIS — N9489 Other specified conditions associated with female genital organs and menstrual cycle: Secondary | ICD-10-CM | POA: Diagnosis not present

## 2015-11-24 DIAGNOSIS — N760 Acute vaginitis: Secondary | ICD-10-CM | POA: Diagnosis not present

## 2015-11-24 DIAGNOSIS — N816 Rectocele: Secondary | ICD-10-CM | POA: Diagnosis not present

## 2015-11-24 DIAGNOSIS — A499 Bacterial infection, unspecified: Secondary | ICD-10-CM | POA: Diagnosis not present

## 2015-11-24 DIAGNOSIS — R198 Other specified symptoms and signs involving the digestive system and abdomen: Secondary | ICD-10-CM

## 2015-11-24 DIAGNOSIS — B9689 Other specified bacterial agents as the cause of diseases classified elsewhere: Secondary | ICD-10-CM

## 2015-11-24 HISTORY — DX: Rectocele: N81.6

## 2015-11-24 HISTORY — DX: Other specified bacterial agents as the cause of diseases classified elsewhere: B96.89

## 2015-11-24 HISTORY — DX: Other specified symptoms and signs involving the digestive system and abdomen: R19.8

## 2015-11-24 HISTORY — DX: Other specified noninflammatory disorders of vagina: N89.8

## 2015-11-24 LAB — POCT WET PREP (WET MOUNT)
Clue Cells Wet Prep Whiff POC: POSITIVE
WBC, Wet Prep HPF POC: POSITIVE

## 2015-11-24 LAB — HEMOCCULT GUIAC POC 1CARD (OFFICE): Fecal Occult Blood, POC: NEGATIVE

## 2015-11-24 MED ORDER — METRONIDAZOLE 500 MG PO TABS: 500.0000 mg | ORAL_TABLET | Freq: Two times a day (BID) | ORAL | Status: DC

## 2015-11-24 NOTE — Patient Instructions (Addendum)
Bacterial Vaginosis Bacterial vaginosis is a vaginal infection that occurs when the normal balance of bacteria in the vagina is disrupted. It results from an overgrowth of certain bacteria. This is the most common vaginal infection in women of childbearing age. Treatment is important to prevent complications, especially in pregnant women, as it can cause a premature delivery. CAUSES  Bacterial vaginosis is caused by an increase in harmful bacteria that are normally present in smaller amounts in the vagina. Several different kinds of bacteria can cause bacterial vaginosis. However, the reason that the condition develops is not fully understood. RISK FACTORS Certain activities or behaviors can put you at an increased risk of developing bacterial vaginosis, including:  Having a new sex partner or multiple sex partners.  Douching.  Using an intrauterine device (IUD) for contraception. Women do not get bacterial vaginosis from toilet seats, bedding, swimming pools, or contact with objects around them. SIGNS AND SYMPTOMS  Some women with bacterial vaginosis have no signs or symptoms. Common symptoms include:  Grey vaginal discharge.  A fishlike odor with discharge, especially after sexual intercourse.  Itching or burning of the vagina and vulva.  Burning or pain with urination. DIAGNOSIS  Your health care provider will take a medical history and examine the vagina for signs of bacterial vaginosis. A sample of vaginal fluid may be taken. Your health care provider will look at this sample under a microscope to check for bacteria and abnormal cells. A vaginal pH test may also be done.  TREATMENT  Bacterial vaginosis may be treated with antibiotic medicines. These may be given in the form of a pill or a vaginal cream. A second round of antibiotics may be prescribed if the condition comes back after treatment. Because bacterial vaginosis increases your risk for sexually transmitted diseases, getting  treated can help reduce your risk for chlamydia, gonorrhea, HIV, and herpes. HOME CARE INSTRUCTIONS   Only take over-the-counter or prescription medicines as directed by your health care provider.  If antibiotic medicine was prescribed, take it as directed. Make sure you finish it even if you start to feel better.  Tell all sexual partners that you have a vaginal infection. They should see their health care provider and be treated if they have problems, such as a mild rash or itching.  During treatment, it is important that you follow these instructions:  Avoid sexual activity or use condoms correctly.  Do not douche.  Avoid alcohol as directed by your health care provider.  Avoid breastfeeding as directed by your health care provider. SEEK MEDICAL CARE IF:   Your symptoms are not improving after 3 days of treatment.  You have increased discharge or pain.  You have a fever. MAKE SURE YOU:   Understand these instructions.  Will watch your condition.  Will get help right away if you are not doing well or get worse. FOR MORE INFORMATION  Centers for Disease Control and Prevention, Division of STD Prevention: SolutionApps.co.zawww.cdc.gov/std American Sexual Health Association (ASHA): www.ashastd.org    This information is not intended to replace advice given to you by your health care provider. Make sure you discuss any questions you have with your health care provider.   Document Released: 11/25/2005 Document Revised: 12/16/2014 Document Reviewed: 07/07/2013 Elsevier Interactive Patient Education 2016 [employer] No alcohol Follow up prn Try miralax or metamucil

## 2015-11-24 NOTE — Progress Notes (Signed)
Subjective:     Patient ID: Stacy Holder, female   DOB: 08/22/1971, 44 y.o.   MRN: [id number]  HPI Stacy Holder is a 44 year old black female in complaining of vaginal discharge with odor, she has changed detergents and soaps and thinks this caused BV, has partner now.She also has rectal pressure and BMs is like rabbit balls at times, but has been dieting and has lost almost 20 lbs in last several months.  Review of Systems Patient denies any headaches, hearing loss, fatigue, blurred vision, shortness of breath, chest pain, abdominal pain, problems with bowel movements, urination, or intercourse. No joint pain or mood swings.See HPI for positives. Reviewed past medical,surgical, social and family history. Reviewed medications and allergies.     Objective:   Physical Exam BP 130/72 mmHg  Pulse 78  Ht 5' 3.5" (1.613 m)  Wt 154 lb 8 oz (70.081 kg)  BMI 26.94 kg/m2 Skin warm and dry.Pelvic: external genitalia is normal in appearance no lesions, vagina: white discharge with odor,urethra has no lesions or masses noted, cervix and uterus are absent, adnexa: no masses or tenderness noted. Bladder is non tender and no masses felt. Wet prep: + for clue cells and +WBCs. GC/CHL obtained. Rectal exam show + rectocele, no masses or hemorrhoids felt, hemoccult negative.    Assessment:     Vaginal discharge Vaginal odor BV Rectal pressure Rectocele     Plan:     Rx flagyl 500 mg 1 bid x 7 days, no alcohol, review handout on BV   GC/CHL sent Try miralax or metamucil  Follow up prn

## 2015-11-26 LAB — GC/CHLAMYDIA PROBE AMP
Chlamydia trachomatis, NAA: NEGATIVE
NEISSERIA GONORRHOEAE BY PCR: NEGATIVE

## 2016-01-02 ENCOUNTER — Telehealth: Payer: Self-pay | Admitting: *Deleted

## 2016-01-02 MED ORDER — METRONIDAZOLE 0.75 % VA GEL: 1.0000 | Freq: Two times a day (BID) | VAGINAL | Status: DC

## 2016-01-02 NOTE — Telephone Encounter (Signed)
Called metrogel in, pt aware

## 2016-01-02 NOTE — Telephone Encounter (Signed)
Pt requesting Flagyl gel verses tablets, states tablets makes her sick. Pt states just started having symptoms this am of BV. Had a refill on the tablets but prefers gel.

## 2016-06-28 ENCOUNTER — Ambulatory Visit: Payer: Managed Care, Other (non HMO) | Admitting: Obstetrics & Gynecology

## 2016-06-28 ENCOUNTER — Telehealth: Payer: Self-pay | Admitting: Obstetrics & Gynecology

## 2016-06-28 NOTE — Telephone Encounter (Signed)
Pt states that she has a bad infection and she missed her appointment today, she thought her appointment was at 1pm. Pt states that she knows what it is and just needs a medication called in. If she could please get a phone call. Pt has an appointment scheduled for wed. But pt thinks that is to long to wait. Please contact pt

## 2016-07-01 NOTE — Telephone Encounter (Signed)
Pt informed to keep her appt for Wednesday with Cathie Beams, CNM for vaginal discharge. Pt verbalized understanding.

## 2016-07-03 ENCOUNTER — Encounter: Payer: Self-pay | Admitting: Advanced Practice Midwife

## 2016-07-03 ENCOUNTER — Ambulatory Visit (INDEPENDENT_AMBULATORY_CARE_PROVIDER_SITE_OTHER): Payer: Managed Care, Other (non HMO) | Admitting: Advanced Practice Midwife

## 2016-07-03 VITALS — BP 140/90 | HR 72 | Ht 63.2 in | Wt 148.0 lb

## 2016-07-03 DIAGNOSIS — N760 Acute vaginitis: Secondary | ICD-10-CM | POA: Diagnosis not present

## 2016-07-03 DIAGNOSIS — N898 Other specified noninflammatory disorders of vagina: Secondary | ICD-10-CM

## 2016-07-03 DIAGNOSIS — A499 Bacterial infection, unspecified: Secondary | ICD-10-CM

## 2016-07-03 DIAGNOSIS — B9689 Other specified bacterial agents as the cause of diseases classified elsewhere: Secondary | ICD-10-CM

## 2016-07-03 MED ORDER — METRONIDAZOLE 0.75 % VA GEL: 1.0000 | Freq: Every day | VAGINAL | 1 refills | Status: DC

## 2016-07-03 NOTE — Progress Notes (Signed)
[facility] Visit  Patient name: Stacy Holder MRN [id number]  Date of birth: May 22, 1971  CC & HPI:  Stacy Holder is a 45 y.o. African American female presenting today for c/o fishy odor an grey dc for 2-3 weeks.  Uses repHresh sometimes, but that did not help.  Has irritation, as well.   Pertinent History Reviewed:  Medical & Surgical Hx:   Past Medical History:  Diagnosis Date  . BV (bacterial vaginosis) 11/24/2015  . Rectal pressure 11/24/2015  . Rectocele 11/24/2015  . Vaginal discharge 11/24/2015  . Vaginal odor 11/24/2015   Past Surgical History:  Procedure Laterality Date  . ABDOMINAL HYSTERECTOMY    . TUBAL LIGATION     Family History  Problem Relation Age of Onset  . Hypertension Mother   . Diabetes Mother   . Kidney failure Father   . Diabetes Father   . Hypertension Sister   . Asthma Daughter   . Diabetes Maternal Grandmother   . Alzheimer's disease Paternal Grandmother     Current Outpatient Prescriptions:  .  estradiol (ESTRACE) 2 MG tablet, Take 1 tablet (2 mg total) by mouth daily., Disp: 30 tablet, Rfl: 11 .  ibuprofen (ADVIL,MOTRIN) 200 MG tablet, Take 800 mg by mouth every 6 (six) hours as needed for mild pain., Disp: , Rfl:  .  Multiple Vitamin (MULTIVITAMIN WITH MINERALS) TABS tablet, Take 1 tablet by mouth daily., Disp: , Rfl:  .  metroNIDAZOLE (METROGEL VAGINAL) 0.75 % vaginal gel, Place 1 Applicatorful vaginally at bedtime., Disp: 70 g, Rfl: 1 Social History: Reviewed -  reports that she has been smoking Cigarettes.  She has smoked for the past 15.00 years. She has never used smokeless tobacco.  Review of Systems:   Constitutional: Negative for fever and chills Eyes: Negative for visual disturbances Respiratory: Negative for shortness of breath, dyspnea Cardiovascular: Negative for chest pain or palpitations  Gastrointestinal: Negative for vomiting, diarrhea and constipation; no abdominal pain Genitourinary: Negative for dysuria  and urgency, vaginal irritation or itching Musculoskeletal: Negative for back pain, joint pain, myalgias  Neurological: Negative for dizziness and headaches    Objective Findings:    Physical Examination: General appearance - well appearing, and in no distress Mental status - alert, oriented to person, place, and time Chest:  Normal respiratory effort Heart - normal rate and regular rhythm Abdomen:  Soft, nontender Pelvic: SSE:  Scant amount of grey discharge w/ amine odor  Wet prep very few clue, no WBC or trich Musculoskeletal:  Normal range of motion without pain Extremities:  No edema    No results found for this or any previous visit (from the past 24 hour(s)).    Assessment & Plan:  A:   BV P:  Metrogel q hs X 5   Return if symptoms worsen or fail to improve.  CRESENZO-DISHMAN,Miana Politte CNM 07/03/2016 9:45 AM

## 2016-09-03 ENCOUNTER — Encounter: Payer: Self-pay | Admitting: Obstetrics & Gynecology

## 2016-09-03 ENCOUNTER — Ambulatory Visit: Payer: Managed Care, Other (non HMO) | Admitting: Obstetrics & Gynecology

## 2016-09-16 ENCOUNTER — Other Ambulatory Visit: Payer: Self-pay | Admitting: Obstetrics & Gynecology

## 2016-09-17 ENCOUNTER — Other Ambulatory Visit: Payer: Self-pay | Admitting: Obstetrics & Gynecology

## 2016-09-17 MED ORDER — ESTRADIOL 2 MG PO TABS: 2.0000 mg | ORAL_TABLET | Freq: Every day | ORAL | 11 refills | Status: DC

## 2017-01-12 ENCOUNTER — Emergency Department (HOSPITAL_COMMUNITY): Payer: Managed Care, Other (non HMO)

## 2017-01-12 ENCOUNTER — Encounter (HOSPITAL_COMMUNITY): Payer: Self-pay | Admitting: Emergency Medicine

## 2017-01-12 ENCOUNTER — Emergency Department (HOSPITAL_COMMUNITY)
Admission: EM | Admit: 2017-01-12 | Discharge: 2017-01-12 | Disposition: A | Discharge status: Home / Self Care | Payer: Managed Care, Other (non HMO) | Attending: Emergency Medicine | Admitting: Emergency Medicine

## 2017-01-12 DIAGNOSIS — Z791 Long term (current) use of non-steroidal anti-inflammatories (NSAID): Secondary | ICD-10-CM | POA: Diagnosis not present

## 2017-01-12 DIAGNOSIS — Y92009 Unspecified place in unspecified non-institutional (private) residence as the place of occurrence of the external cause: Secondary | ICD-10-CM | POA: Diagnosis not present

## 2017-01-12 DIAGNOSIS — Y999 Unspecified external cause status: Secondary | ICD-10-CM | POA: Diagnosis not present

## 2017-01-12 DIAGNOSIS — S299XXA Unspecified injury of thorax, initial encounter: Secondary | ICD-10-CM | POA: Diagnosis present

## 2017-01-12 DIAGNOSIS — Y939 Activity, unspecified: Secondary | ICD-10-CM | POA: Insufficient documentation

## 2017-01-12 DIAGNOSIS — S0033XA Contusion of nose, initial encounter: Secondary | ICD-10-CM | POA: Diagnosis not present

## 2017-01-12 DIAGNOSIS — S20219A Contusion of unspecified front wall of thorax, initial encounter: Secondary | ICD-10-CM | POA: Diagnosis not present

## 2017-01-12 DIAGNOSIS — F17210 Nicotine dependence, cigarettes, uncomplicated: Secondary | ICD-10-CM | POA: Diagnosis not present

## 2017-01-12 MED ORDER — NAPROXEN 500 MG PO TABS: 500.0000 mg | ORAL_TABLET | Freq: Two times a day (BID) | ORAL | 0 refills | Status: DC

## 2017-01-12 MED ORDER — TRAMADOL HCL 50 MG PO TABS: 50.0000 mg | ORAL_TABLET | Freq: Four times a day (QID) | ORAL | 0 refills | Status: DC | PRN
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
## 2017-01-12 NOTE — ED Notes (Signed)
Patient given ice pak at this time 

## 2017-02-16 ENCOUNTER — Encounter (HOSPITAL_COMMUNITY): Payer: Self-pay | Admitting: *Deleted

## 2017-02-16 ENCOUNTER — Emergency Department (HOSPITAL_COMMUNITY)
Admission: EM | Admit: 2017-02-16 | Discharge: 2017-02-17 | Disposition: A | Discharge status: Home / Self Care | Payer: Managed Care, Other (non HMO) | Attending: Emergency Medicine | Admitting: Emergency Medicine

## 2017-02-16 DIAGNOSIS — S00531A Contusion of lip, initial encounter: Secondary | ICD-10-CM | POA: Diagnosis not present

## 2017-02-16 DIAGNOSIS — H1132 Conjunctival hemorrhage, left eye: Secondary | ICD-10-CM | POA: Insufficient documentation

## 2017-02-16 DIAGNOSIS — S060X0A Concussion without loss of consciousness, initial encounter: Secondary | ICD-10-CM | POA: Insufficient documentation

## 2017-02-16 DIAGNOSIS — S6391XA Sprain of unspecified part of right wrist and hand, initial encounter: Secondary | ICD-10-CM | POA: Diagnosis not present

## 2017-02-16 DIAGNOSIS — Z23 Encounter for immunization: Secondary | ICD-10-CM | POA: Insufficient documentation

## 2017-02-16 DIAGNOSIS — S0990XA Unspecified injury of head, initial encounter: Secondary | ICD-10-CM | POA: Diagnosis present

## 2017-02-16 DIAGNOSIS — S6392XA Sprain of unspecified part of left wrist and hand, initial encounter: Secondary | ICD-10-CM | POA: Diagnosis not present

## 2017-02-16 DIAGNOSIS — Y939 Activity, unspecified: Secondary | ICD-10-CM | POA: Insufficient documentation

## 2017-02-16 DIAGNOSIS — Y999 Unspecified external cause status: Secondary | ICD-10-CM | POA: Insufficient documentation

## 2017-02-16 DIAGNOSIS — F17210 Nicotine dependence, cigarettes, uncomplicated: Secondary | ICD-10-CM | POA: Diagnosis not present

## 2017-02-16 DIAGNOSIS — Y929 Unspecified place or not applicable: Secondary | ICD-10-CM | POA: Insufficient documentation

## 2017-02-16 DIAGNOSIS — S0083XA Contusion of other part of head, initial encounter: Secondary | ICD-10-CM
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
## 2017-02-17 ENCOUNTER — Emergency Department (HOSPITAL_COMMUNITY): Payer: Managed Care, Other (non HMO)

## 2017-02-17 MED ORDER — IBUPROFEN 600 MG PO TABS: 600.0000 mg | ORAL_TABLET | Freq: Three times a day (TID) | ORAL | 0 refills | Status: DC | PRN

## 2017-02-17 MED ORDER — HYDROCODONE-ACETAMINOPHEN 5-325 MG PO TABS: 1.0000 | ORAL_TABLET | Freq: Four times a day (QID) | ORAL | 0 refills | Status: DC | PRN

## 2017-02-17 MED ORDER — HYDROCODONE-ACETAMINOPHEN 5-325 MG PO TABS
2.0000 | ORAL_TABLET | Freq: Once | ORAL | Status: AC
  Administered 2017-02-17: 2 via ORAL
  Filled 2017-02-17: qty 2

## 2017-02-17 MED ORDER — TETANUS-DIPHTH-ACELL PERTUSSIS 5-2.5-18.5 LF-MCG/0.5 IM SUSP
0.5000 mL | Freq: Once | INTRAMUSCULAR | Status: AC
  Administered 2017-02-17: 0.5 mL via INTRAMUSCULAR
  Filled 2017-02-17: qty 0.5
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
## 2017-04-01 ENCOUNTER — Encounter: Payer: Self-pay | Admitting: Obstetrics & Gynecology

## 2017-04-01 ENCOUNTER — Ambulatory Visit (INDEPENDENT_AMBULATORY_CARE_PROVIDER_SITE_OTHER): Payer: Managed Care, Other (non HMO) | Admitting: Obstetrics & Gynecology

## 2017-04-01 VITALS — BP 90/60 | HR 72 | Wt 153.0 lb

## 2017-04-01 DIAGNOSIS — B373 Candidiasis of vulva and vagina: Secondary | ICD-10-CM | POA: Diagnosis not present

## 2017-04-01 DIAGNOSIS — N952 Postmenopausal atrophic vaginitis: Secondary | ICD-10-CM

## 2017-04-01 DIAGNOSIS — B3731 Acute candidiasis of vulva and vagina: Secondary | ICD-10-CM

## 2017-04-01 MED ORDER — ESTRADIOL 0.1 MG/GM VA CREA: TOPICAL_CREAM | VAGINAL | 12 refills | Status: DC

## 2017-04-01 MED ORDER — ESTRADIOL 2 MG PO TABS: 2.0000 mg | ORAL_TABLET | Freq: Every day | ORAL | 11 refills | Status: DC

## 2017-04-01 MED ORDER — TERCONAZOLE 0.4 % VA CREA: 1.0000 | TOPICAL_CREAM | Freq: Every day | VAGINAL | 0 refills | Status: DC
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
## 2017-05-12 ENCOUNTER — Ambulatory Visit: Payer: Managed Care, Other (non HMO) | Admitting: Podiatry

## 2017-05-15 ENCOUNTER — Encounter: Payer: Self-pay | Admitting: Podiatry

## 2017-05-15 ENCOUNTER — Ambulatory Visit (INDEPENDENT_AMBULATORY_CARE_PROVIDER_SITE_OTHER): Payer: Managed Care, Other (non HMO) | Admitting: Podiatry

## 2017-05-15 VITALS — HR 69 | Ht 63.0 in | Wt 153.0 lb

## 2017-05-15 DIAGNOSIS — M79675 Pain in left toe(s): Secondary | ICD-10-CM

## 2017-05-15 DIAGNOSIS — M7989 Other specified soft tissue disorders: Secondary | ICD-10-CM | POA: Diagnosis not present

## 2017-05-15 DIAGNOSIS — L97501 Non-pressure chronic ulcer of other part of unspecified foot limited to breakdown of skin: Secondary | ICD-10-CM

## 2017-05-15 DIAGNOSIS — M2042 Other hammer toe(s) (acquired), left foot: Secondary | ICD-10-CM | POA: Diagnosis not present

## 2017-05-15 NOTE — Patient Instructions (Signed)
Seen for painful lesion 5th toe left. Noted of pre ulcerative skin lesion. All available treatment options reviewed. Lesion debrided. Surgery consent form reviewed for Hammer toe repair 5th digit left.

## 2017-05-15 NOTE — Progress Notes (Signed)
Stated that she is doing good and much better.  SUBJECTIVE: 46 y.o. year old female presents complaining of painful toe duration of 2 years. She was using corn pad for painful lesion in between the 4th and 5th digits. 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 She was referred by Dr. Zachary Georgeomaszewski.  Hysterectomy. REVIEW OF SYSTEMS: A comprehensive review of systems was negative except for: Positive history of Hysterectomy.  OBJECTIVE: DERMATOLOGIC EXAMINATION: Nails: Normal. Pre ulcerative digital corn 5th digit medial aspect PIPJ and DIPJ. Pinch callus plantar medial both great toe.  VASCULAR EXAMINATION OF LOWER LIMBS: All pedal pulses are palpable with normal pulsation.  Capillary Filling times within 3 seconds in all digits.  No edema or erythema noted. Temperature gradient from tibial crest to dorsum of foot is within normal bilateral.  NEUROLOGIC EXAMINATION OF THE LOWER LIMBS: All epicritic and tactile sensations grossly intact. Sharp and Dull discriminatory sensations at the plantar ball of hallux is intact bilateral.   MUSCULOSKELETAL EXAMINATION: Positive for contracted 5th digit with digital lesion at contact surface with the 4th toe left foot. Positive of mild bunion deformity bilateral.   ASSESSMENT: Painful hammer toe with ulcerating digital corn 5th left.  PLAN: Reviewed clinical findings and available treatment options. As per request surgery consent form was reviewed for Hammer toe repair 5th digit left.

## 2017-05-29 ENCOUNTER — Other Ambulatory Visit: Payer: Self-pay | Admitting: Podiatry

## 2017-05-29 MED ORDER — HYDROCODONE-IBUPROFEN 7.5-200 MG PO TABS: 1.0000 | ORAL_TABLET | Freq: Four times a day (QID) | ORAL | 0 refills | Status: DC | PRN

## 2017-05-29 NOTE — Progress Notes (Unsigned)
vicodi

## 2017-05-30 DIAGNOSIS — M2042 Other hammer toe(s) (acquired), left foot: Secondary | ICD-10-CM | POA: Diagnosis not present

## 2017-05-30 HISTORY — PX: OTHER SURGICAL HISTORY: SHX169

## 2017-06-03 ENCOUNTER — Encounter: Payer: Self-pay | Admitting: *Deleted

## 2017-06-05 ENCOUNTER — Ambulatory Visit (INDEPENDENT_AMBULATORY_CARE_PROVIDER_SITE_OTHER): Payer: Managed Care, Other (non HMO) | Admitting: Podiatry

## 2017-06-05 ENCOUNTER — Encounter: Payer: Self-pay | Admitting: Podiatry

## 2017-06-05 DIAGNOSIS — Z9889 Other specified postprocedural states: Secondary | ICD-10-CM

## 2017-06-05 DIAGNOSIS — M2042 Other hammer toe(s) (acquired), left foot: Secondary | ICD-10-CM

## 2017-06-05 NOTE — Progress Notes (Signed)
1 week post op hammer toe repair 5th left 05/30/17.  Dressing intact. Denies any discomfort. Normal wound healing. Dressing changed. Return in one week.

## 2017-06-05 NOTE — Patient Instructions (Signed)
1 week post op wound healing normal. Dressing changed. Continue to do light activity. Return in one week for suture removal.

## 2017-06-12 ENCOUNTER — Encounter: Payer: Self-pay | Admitting: Podiatry

## 2017-06-12 ENCOUNTER — Ambulatory Visit (INDEPENDENT_AMBULATORY_CARE_PROVIDER_SITE_OTHER): Payer: Managed Care, Other (non HMO) | Admitting: Podiatry

## 2017-06-12 DIAGNOSIS — Z9889 Other specified postprocedural states: Secondary | ICD-10-CM

## 2017-06-12 NOTE — Progress Notes (Signed)
2 week post op hammer toe repair 5th left 05/30/17.  Dressing intact. Denies any discomfort. Sutures removed. Normal wound healing. Mefix tape applied with instruction and supply Return in 2 weeks. If doing ok will release back to work.

## 2017-06-12 NOTE — Patient Instructions (Signed)
2 weeks post op wound healing normal. Sutures removed. Mefix tape placed with instruction. Return in 2 weeks.

## 2017-06-26 ENCOUNTER — Ambulatory Visit (INDEPENDENT_AMBULATORY_CARE_PROVIDER_SITE_OTHER): Payer: Managed Care, Other (non HMO) | Admitting: Podiatry

## 2017-06-26 ENCOUNTER — Encounter: Payer: Self-pay | Admitting: Podiatry

## 2017-06-26 DIAGNOSIS — Z9889 Other specified postprocedural states: Secondary | ICD-10-CM

## 2017-06-26 NOTE — Patient Instructions (Signed)
4 weeks post op wound healing normal with some pain and swelling. Wrapping instruction given with supply. Ok to return to work.

## 2017-06-26 NOTE — Progress Notes (Signed)
4 week post op hammer toe repair 5th left 05/30/17.  Patient complained of having pain and swelling on the 5th toe. Been on feet and been using Ace wrap. Mefix tape and Coban bandage applied with instruction and supply Ok to return to work on 06/30/17. Return if pain continues for possible injection.

## 2017-07-29 ENCOUNTER — Encounter: Payer: Self-pay | Admitting: *Deleted

## 2017-07-29 ENCOUNTER — Other Ambulatory Visit: Payer: Managed Care, Other (non HMO) | Admitting: Obstetrics & Gynecology

## 2017-10-29 ENCOUNTER — Telehealth: Payer: Self-pay | Admitting: Obstetrics & Gynecology

## 2017-10-29 NOTE — Telephone Encounter (Signed)
LMOVM that she has refill on her estradiol until April. Advised to call pharmacy for refill.

## 2017-10-29 NOTE — Telephone Encounter (Signed)
Patient called our after hours nurse stating that she needs a refill of her hormone pills. Please contact pt

## 2018-01-02 ENCOUNTER — Other Ambulatory Visit: Payer: Self-pay | Admitting: Obstetrics & Gynecology

## 2018-07-23 ENCOUNTER — Encounter: Payer: Self-pay | Admitting: *Deleted

## 2018-07-23 ENCOUNTER — Ambulatory Visit: Payer: Managed Care, Other (non HMO) | Admitting: Obstetrics & Gynecology

## 2018-08-20 ENCOUNTER — Encounter (INDEPENDENT_AMBULATORY_CARE_PROVIDER_SITE_OTHER): Payer: Self-pay

## 2018-08-20 ENCOUNTER — Ambulatory Visit: Payer: 59 | Admitting: Women's Health

## 2018-08-20 ENCOUNTER — Encounter: Payer: Self-pay | Admitting: Women's Health

## 2018-08-20 VITALS — BP 144/93 | HR 72 | Ht 63.0 in | Wt 166.5 lb

## 2018-08-20 DIAGNOSIS — N898 Other specified noninflammatory disorders of vagina: Secondary | ICD-10-CM | POA: Diagnosis not present

## 2018-08-20 NOTE — Progress Notes (Signed)
   GYN VISIT Patient name: Roselyn Meiererry D Hoeppner MRN [id number]  Date of birth: 08/09/1971 Chief Complaint:   vaginal discharge with odor (vaginal irritation)  History of Present Illness:   Roselyn Meiererry D Spadoni is a [age] y.o. [phone] African American female being seen today for report of vulvovaginal irritation and itchign, then had sex for the first time in >3649yr and began to have odor and continued irritation. Used otc monistat ovule which helped w/ itching, still feels irritated.      No LMP recorded. Patient has had a hysterectomy. The current method of family planning is status post hysterectomy. Last pap >5958yrs ago. Results were:  normal Review of Systems:   Pertinent items are noted in HPI Denies fever/chills, dizziness, headaches, visual disturbances, fatigue, shortness of breath, chest pain, abdominal pain, vomiting, abnormal vaginal discharge/itching/odor/irritation, problems with periods, bowel movements, urination, or intercourse unless otherwise stated above.  Pertinent History Reviewed:  Reviewed past medical,surgical, social, obstetrical and family history.  Reviewed problem list, medications and allergies. Physical Assessment:   Vitals:   08/20/18 1155  BP: (!) 144/93  Pulse: 72  Weight: 166 lb 8 oz (75.5 kg)  Height: 5\' 3"  (1.6 m)  Body mass index is 29.49 kg/m.       Physical Examination:   General appearance: alert, well appearing, and in no distress  Mental status: alert, oriented to person, place, and time  Skin: warm & dry   Cardiovascular: normal heart rate noted  Respiratory: normal respiratory effort, no distress  Abdomen: soft, non-tender   Pelvic: VULVA: normal appearing vulva with no masses, tenderness or lesions, VAGINA: normal appearing vagina with normal color and nonodorous discharge, no lesions  Extremities: no edema   No results found for this or any previous visit (from the past 24 hour(s)).  Assessment & Plan:  1) Vulvovaginal irritation> send nuswab  plus, will call her w/ results  Meds: No orders of the defined types were placed in this encounter.   Orders Placed This Encounter  Procedures  . NuSwab Vaginitis Plus (VG+)    Return for schedule physical please.  Cheral MarkerKimberly R Jaysen Wey CNM, [facility]NP-BC 08/20/2018 12:35 PM

## 2018-08-23 LAB — NUSWAB VAGINITIS PLUS (VG+)
CANDIDA ALBICANS, NAA: NEGATIVE
CANDIDA GLABRATA, NAA: NEGATIVE
Chlamydia trachomatis, NAA: NEGATIVE
Neisseria gonorrhoeae, NAA: NEGATIVE
TRICH VAG BY NAA: NEGATIVE

## 2018-08-25 ENCOUNTER — Telehealth: Payer: Self-pay | Admitting: *Deleted

## 2018-08-25 NOTE — Telephone Encounter (Signed)
Patient states she is not having any symptoms now just burning when she urinates.  Spoke with Selena BattenKim and patient to come in the morning to have urine checked.

## 2018-08-25 NOTE — Telephone Encounter (Signed)
VM not set up on either line listed.

## 2018-08-26 ENCOUNTER — Telehealth: Payer: Self-pay | Admitting: *Deleted

## 2018-08-26 ENCOUNTER — Other Ambulatory Visit (INDEPENDENT_AMBULATORY_CARE_PROVIDER_SITE_OTHER): Payer: 59

## 2018-08-26 DIAGNOSIS — R300 Dysuria: Secondary | ICD-10-CM | POA: Diagnosis not present

## 2018-08-26 LAB — POCT URINALYSIS DIPSTICK OB
Blood, UA: NEGATIVE
Glucose, UA: NEGATIVE
KETONES UA: NEGATIVE
LEUKOCYTES UA: NEGATIVE
NITRITE UA: NEGATIVE
PROTEIN: NEGATIVE

## 2018-08-26 MED ORDER — METRONIDAZOLE 500 MG PO TABS: 500.0000 mg | ORAL_TABLET | Freq: Two times a day (BID) | ORAL | 0 refills | Status: DC

## 2018-08-26 NOTE — Telephone Encounter (Signed)
Informed pt that urine dip was negative. Advised that we would send her urine for culture and treat if that came back positive. Advised that Stacy BattenKim would send in medication for BV if she desired. Pt states that she would like to try the medication. Advised to check with her pharmacy later today. Pt verbalized understanding.

## 2018-08-28 LAB — URINE CULTURE

## 2018-09-01 ENCOUNTER — Other Ambulatory Visit (HOSPITAL_COMMUNITY): Payer: Self-pay | Admitting: Family Medicine

## 2018-09-01 ENCOUNTER — Other Ambulatory Visit (HOSPITAL_COMMUNITY): Payer: Self-pay | Admitting: Internal Medicine

## 2018-09-01 ENCOUNTER — Ambulatory Visit (HOSPITAL_COMMUNITY)
Admission: RE | Admit: 2018-09-01 | Discharge: 2018-09-01 | Disposition: A | Discharge status: Home / Self Care | Payer: 59 | Source: Ambulatory Visit | Attending: Family Medicine | Admitting: Family Medicine

## 2018-09-01 DIAGNOSIS — H748X2 Other specified disorders of left middle ear and mastoid: Secondary | ICD-10-CM | POA: Diagnosis not present

## 2018-09-01 DIAGNOSIS — R51 Headache: Secondary | ICD-10-CM | POA: Insufficient documentation

## 2018-09-01 DIAGNOSIS — R519 Headache, unspecified: Secondary | ICD-10-CM

## 2018-09-01 DIAGNOSIS — Z1231 Encounter for screening mammogram for malignant neoplasm of breast: Secondary | ICD-10-CM

## 2018-09-03 ENCOUNTER — Ambulatory Visit (HOSPITAL_COMMUNITY)
Admission: RE | Admit: 2018-09-03 | Discharge: 2018-09-03 | Disposition: A | Discharge status: Home / Self Care | Payer: 59 | Source: Ambulatory Visit | Attending: Internal Medicine | Admitting: Internal Medicine

## 2018-09-03 DIAGNOSIS — Z1231 Encounter for screening mammogram for malignant neoplasm of breast: Secondary | ICD-10-CM | POA: Insufficient documentation

## 2018-09-08 ENCOUNTER — Other Ambulatory Visit: Payer: Self-pay

## 2018-09-08 ENCOUNTER — Emergency Department (HOSPITAL_COMMUNITY)
Admission: EM | Admit: 2018-09-08 | Discharge: 2018-09-08 | Disposition: A | Discharge status: Home / Self Care | Payer: 59 | Attending: Emergency Medicine | Admitting: Emergency Medicine

## 2018-09-08 ENCOUNTER — Emergency Department (HOSPITAL_COMMUNITY): Payer: 59

## 2018-09-08 ENCOUNTER — Encounter (HOSPITAL_COMMUNITY): Payer: Self-pay | Admitting: Emergency Medicine

## 2018-09-08 DIAGNOSIS — H9202 Otalgia, left ear: Secondary | ICD-10-CM

## 2018-09-08 DIAGNOSIS — F17210 Nicotine dependence, cigarettes, uncomplicated: Secondary | ICD-10-CM | POA: Insufficient documentation

## 2018-09-08 DIAGNOSIS — R51 Headache: Secondary | ICD-10-CM | POA: Diagnosis not present

## 2018-09-08 DIAGNOSIS — R519 Headache, unspecified: Secondary | ICD-10-CM

## 2018-09-08 LAB — COMPREHENSIVE METABOLIC PANEL
ALBUMIN: 4 g/dL (ref 3.5–5.0)
ALK PHOS: 49 U/L (ref 38–126)
ALT: 16 U/L (ref 0–44)
AST: 22 U/L (ref 15–41)
Anion gap: 8 (ref 5–15)
BUN: 7 mg/dL (ref 6–20)
CALCIUM: 8.9 mg/dL (ref 8.9–10.3)
CHLORIDE: 104 mmol/L (ref 98–111)
CO2: 29 mmol/L (ref 22–32)
CREATININE: 0.96 mg/dL (ref 0.44–1.00)
GFR calc non Af Amer: >60 mL/min (ref >60)
GLUCOSE: 90 mg/dL (ref 70–99)
Potassium: 3.5 mmol/L (ref 3.5–5.1)
Sodium: 141 mmol/L (ref 135–145)
Total Bilirubin: 0.6 mg/dL (ref 0.3–1.2)
Total Protein: 7.3 g/dL (ref 6.5–8.1)

## 2018-09-08 LAB — CBC WITH DIFFERENTIAL/PLATELET
BASOS PCT: 0 %
Basophils Absolute: 0 10*3/uL (ref 0.0–0.1)
Eosinophils Absolute: 0.2 10*3/uL (ref 0.0–0.7)
Eosinophils Relative: 1 %
HEMATOCRIT: 41.6 % (ref 36.0–46.0)
HEMOGLOBIN: 13.6 g/dL (ref 12.0–15.0)
LYMPHS ABS: 4.7 10*3/uL — AB (ref 0.7–4.0)
Lymphocytes Relative: 46 %
MCH: 30.6 pg (ref 26.0–34.0)
MCHC: 32.7 g/dL (ref 30.0–36.0)
MCV: 93.7 fL (ref 78.0–100.0)
MONO ABS: 0.6 10*3/uL (ref 0.1–1.0)
MONOS PCT: 6 %
NEUTROS ABS: 4.8 10*3/uL (ref 1.7–7.7)
NEUTROS PCT: 47 %
Platelets: 273 10*3/uL (ref 150–400)
RBC: 4.44 MIL/uL (ref 3.87–5.11)
RDW: 13.1 % (ref 11.5–15.5)
WBC: 10.4 10*3/uL (ref 4.0–10.5)

## 2018-09-08 LAB — I-STAT CG4 LACTIC ACID, ED: Lactic Acid, Venous: 1.29 mmol/L (ref 0.5–1.9)

## 2018-09-08 MED ORDER — IOHEXOL 300 MG/ML  SOLN
75.0000 mL | Freq: Once | INTRAMUSCULAR | Status: AC | PRN
  Administered 2018-09-08: 75 mL via INTRAVENOUS

## 2018-09-08 MED ORDER — METOCLOPRAMIDE HCL 10 MG PO TABS: 10.0000 mg | ORAL_TABLET | Freq: Four times a day (QID) | ORAL | 0 refills | Status: DC | PRN

## 2018-09-08 MED ORDER — SODIUM CHLORIDE 0.9 % IV BOLUS
500.0000 mL | Freq: Once | INTRAVENOUS | Status: AC
Start: 2018-09-08 — End: 2018-09-08
  Administered 2018-09-08: 500 mL via INTRAVENOUS

## 2018-09-08 MED ORDER — METOCLOPRAMIDE HCL 5 MG/ML IJ SOLN
10.0000 mg | Freq: Once | INTRAMUSCULAR | Status: AC
  Administered 2018-09-08: 10 mg via INTRAVENOUS
  Filled 2018-09-08: qty 2

## 2018-09-08 MED ORDER — KETOROLAC TROMETHAMINE 30 MG/ML IJ SOLN
30.0000 mg | Freq: Once | INTRAMUSCULAR | Status: AC
  Administered 2018-09-08: 30 mg via INTRAVENOUS
  Filled 2018-09-08: qty 1

## 2018-09-08 MED ORDER — IBUPROFEN 800 MG PO TABS: 800.0000 mg | ORAL_TABLET | Freq: Three times a day (TID) | ORAL | 0 refills | Status: DC | PRN

## 2018-09-08 NOTE — Discharge Instructions (Signed)

## 2018-09-08 NOTE — ED Provider Notes (Signed)
Emergency Department Provider Note   I have reviewed the triage vital signs and the nursing notes.   HISTORY  Chief Complaint No chief complaint on file.   HPI Stacy Holder is a 47 y.o. female with PMH of ear infections and abdominal hysterectomy resents to the emergency department with left ear pain/headache.  The patient saw her PCP last week and had an MRI of the brain which showed partial opacification of the left mastoid.  She was started on Augmentin, prednisone, and tramadol for pain.  She continues to have headache behind the left ear and is now having some additional headache symptoms that seem to be worsening, not improving.  She denies any fevers or chills.  She has 4 more days of her Augmentin but has been taking antibiotics for the past 6 days.  She called the ENT provider and was scheduled for an appointment on 10/31.  With worsening symptoms, she was advised by her PCP office to present to the emergency department. Denies vision changes or ear drainage.    Past Medical History:  Diagnosis Date  . BV (bacterial vaginosis) 11/24/2015  . Rectal pressure 11/24/2015  . Rectocele 11/24/2015  . Vaginal discharge 11/24/2015  . Vaginal odor 11/24/2015    Patient Active Problem List   Diagnosis Date Noted  . BV (bacterial vaginosis) 11/24/2015  . Rectal pressure 11/24/2015  . Rectocele 11/24/2015    Past Surgical History:  Procedure Laterality Date  . ABDOMINAL HYSTERECTOMY    . Hammer Toe Repair Left 05/30/2017   Left #5 Toe  . TUBAL LIGATION      Allergies Latex and Tape  Family History  Problem Relation Age of Onset  . Hypertension Mother   . Diabetes Mother   . Kidney failure Father   . Diabetes Father   . Hypertension Sister   . Breast cancer Sister   . Asthma Daughter   . Diabetes Maternal Grandmother   . Alzheimer's disease Paternal Grandmother     Social History Social History   Tobacco Use  . Smoking status: Current Some Day Smoker    Years: 15.00    Types: Cigarettes  . Smokeless tobacco: Never Used  . Tobacco comment: smokes 3 cig daily  Substance Use Topics  . Alcohol use: Yes    Comment: occ  . Drug use: No    Review of Systems  Constitutional: No fever/chills Eyes: No visual changes. ENT: No sore throat. Left posterior ear pain.  Cardiovascular: Denies chest pain. Respiratory: Denies shortness of breath. Gastrointestinal: No abdominal pain. Positive nausea, no vomiting.  No diarrhea.  No constipation. Genitourinary: Negative for dysuria. Musculoskeletal: Negative for back pain. Skin: Negative for rash. Neurological: Negative for focal weakness or numbness. Positive left posterior HA.   10-point ROS otherwise negative.  ____________________________________________   PHYSICAL EXAM:  VITAL SIGNS: ED Triage Vitals  Enc Vitals Group     BP 09/08/18 1108 (!) 154/100     Pulse Rate 09/08/18 1107 70     Resp 09/08/18 1107 18     Temp 09/08/18 1107 98.3 F (36.8 C)     Temp Source 09/08/18 1107 Oral     SpO2 09/08/18 1107 100 %     Weight 09/08/18 1108 165 lb 5.5 oz (75 kg)     Height 09/08/18 1108 5\' 3"  (1.6 m)     Pain Score 09/08/18 1111 10   Constitutional: Alert and oriented. Well appearing and in no acute distress. Eyes: Conjunctivae are normal. PERRL.  Head: Atraumatic. Ears:  Healthy appearing ear canals with TM opacification without bulging. Positive left mastoid tenderness to palpation.  Nose: No congestion/rhinnorhea. Mouth/Throat: Mucous membranes are moist.  Oropharynx non-erythematous. Neck: No stridor. No meningeal findings.  Cardiovascular: Normal rate, regular rhythm. Good peripheral circulation. Grossly normal heart sounds.   Respiratory: Normal respiratory effort.  No retractions. Lungs CTAB. Gastrointestinal: Soft and nontender. No distention.  Musculoskeletal: No lower extremity tenderness nor edema. No gross deformities of extremities. Neurologic:  Normal speech and  language. No gross focal neurologic deficits are appreciated.  Skin:  Skin is warm, dry and intact. No rash noted.  ____________________________________________   LABS (all labs ordered are listed, but only abnormal results are displayed)  Labs Reviewed  CBC WITH DIFFERENTIAL/PLATELET - Abnormal; Notable for the following components:      Result Value   Lymphs Abs 4.7 (*)    All other components within normal limits  COMPREHENSIVE METABOLIC PANEL  I-STAT CG4 LACTIC ACID, ED  I-STAT CG4 LACTIC ACID, ED   ____________________________________________  RADIOLOGY  Ct Head W Or Wo Contrast  Result Date: 09/08/2018 CLINICAL DATA:  47 year old female with left mastoid fluid on MRI last week. Started on oral antibiotics. Continued headaches, progressive pain today. EXAM: CT HEAD WITHOUT AND WITH CONTRAST TECHNIQUE: Contiguous axial images were obtained from the base of the skull through the vertex without and with intravenous contrast CONTRAST:  75mL OMNIPAQUE IOHEXOL 300 MG/ML  SOLN COMPARISON:  Brain MRI 09/01/2018.  Face CT 02/17/2017. FINDINGS: Brain: Normal cerebral volume. No midline shift, ventriculomegaly, mass effect, evidence of mass lesion, intracranial hemorrhage or evidence of cortically based acute infarction. Gray-white matter differentiation is within normal limits throughout the brain. No abnormal enhancement identified. Vascular: The major intracranial vascular structures are enhancing and appear patent, including the sigmoid dural venous sinuses. Skull: Negative. Sinuses/Orbits: The mild left mastoid fluid demonstrated by MRI last month is less apparent and may have regressed. There is no associated mastoid coalescence or temporal bone erosion. Minimal contralateral right inferior mastoid air cell fluid is stable since 2018. The tympanic cavities remain clear. Visualized paranasal sinuses are stable and well pneumatized. Other: Visualized orbits and scalp soft tissues are within  normal limits. IMPRESSION: 1. Normal CT appearance of the brain. No explanation for progressive headaches. 2. The mild left mastoid fluid seen on 09/01/18 may have regressed, but regardless this was more consistent with a postinflammatory mild effusion rather than a middle ear or mastoid infection - and as such clinical significance is doubtful. Electronically Signed   By: Odessa Fleming M.D.   On: 09/08/2018 13:36    ____________________________________________   PROCEDURES  Procedure(s) performed:   Procedures   ____________________________________________   INITIAL IMPRESSION / ASSESSMENT AND PLAN / ED COURSE  Pertinent labs & imaging results that were available during my care of the patient were reviewed by me and considered in my medical decision making (see chart for details).  Patient presents to the emergency department with concern for mastoiditis not responding to oral antibiotics.  She has not had fevers but does have tenderness over the left mastoid and left posterior scalp.  The virtual opacification of the left mastoid was identified on an outpatient MRI from 09/24.  For CT imaging here for further evaluation along with labs, Toradol for headache, and reassess. No concern clinically for meningitis.   2:02 PM CT imaging reviewed which shows interval resolution of the mastoid inflammation.  Radiology notes that the initial area of inflammation seemed more postinflammatory  as opposed to acutely infectious.  Patient has no other lab findings or clinical findings to suggest a complicated otitis media or developing suppurative infection.  No concern for meningitis.  Patient's headache symptoms have completely resolved with migraine cocktail in the emergency department.  I have advised that she continue the antibiotics prescribed by her PCP and follow with ENT as scheduled.  I will also provide contact information for a local neurologist and advised that she keep a headache diary.   Patient  feeling well at the time of discharge.  ____________________________________________  FINAL CLINICAL IMPRESSION(S) / ED DIAGNOSES  Final diagnoses:  Acute nonintractable headache, unspecified headache type  Left ear pain     MEDICATIONS GIVEN DURING THIS VISIT:  Medications  ketorolac (TORADOL) 30 MG/ML injection 30 mg (30 mg Intravenous Given 09/08/18 1211)  metoCLOPramide (REGLAN) injection 10 mg (10 mg Intravenous Given 09/08/18 1211)  sodium chloride 0.9 % bolus 500 mL (500 mLs Intravenous New Bag/Given 09/08/18 1211)  iohexol (OMNIPAQUE) 300 MG/ML solution 75 mL (75 mLs Intravenous Contrast Given 09/08/18 1313)     NEW OUTPATIENT MEDICATIONS STARTED DURING THIS VISIT:  New Prescriptions   IBUPROFEN (ADVIL,MOTRIN) 800 MG TABLET    Take 1 tablet (800 mg total) by mouth every 8 (eight) hours as needed for headache.   METOCLOPRAMIDE (REGLAN) 10 MG TABLET    Take 1 tablet (10 mg total) by mouth every 6 (six) hours as needed for nausea (and headache symptoms).    Note:  This document was prepared using Dragon voice recognition software and may include unintentional dictation errors.  Alona Bene, MD Emergency Medicine    Long, Arlyss Repress, MD 09/08/18 [phone]

## 2018-09-08 NOTE — ED Triage Notes (Signed)
Pt having headaches x 2weeks.  Had MRI was told she had mastoiditis. Given oral abx.  Was told by pcp to come to ED today for possible IV abx.

## 2018-09-17 ENCOUNTER — Telehealth: Payer: Self-pay | Admitting: *Deleted

## 2018-09-17 NOTE — Telephone Encounter (Signed)
Pt called requesting the results from her urine culture. DOB verified. Informed pt that results were negative. She states that she was on another antibiotic so she just started taking the metronidazole that she was prescribed. She states that the irritation she was having is getting better. She c/o dark urine. Advised pt that she should increase her water intake to see if that will clear her urine up. Advised to call back if things worsen or fail to improve after treatment. Pt verbalized understanding.

## 2018-10-07 IMAGING — CT CT HEAD WO/W CM
3 of 4 series · 15 of 47 positions shown, 18 images · IV contrast (Isovue)
Comparison: Brain MRI 09/01/2018.  Face CT 02/17/2017.

CLINICAL DATA: 47-year-old female with left mastoid fluid on MRI
last week. Started on oral antibiotics. Continued headaches,
progressive pain today.

EXAM:
CT HEAD WITHOUT AND WITH CONTRAST
TECHNIQUE: Contiguous axial images were obtained from the base of the skull
through the vertex without and with intravenous contrast
CONTRAST:  75mL OMNIPAQUE IOHEXOL 300 MG/ML  SOLN

[Series 3: head w · axial · 0.42mm/px · z∈[+1624,+1764]mm · 9 of 34 slices shown, 12 images]
[im 3/34  brain]
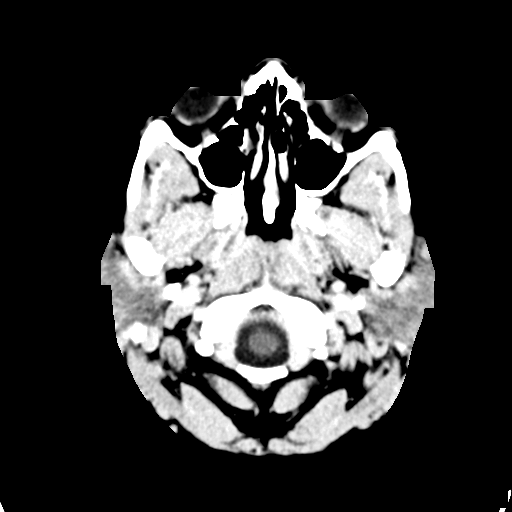
[im 3/34  bone]
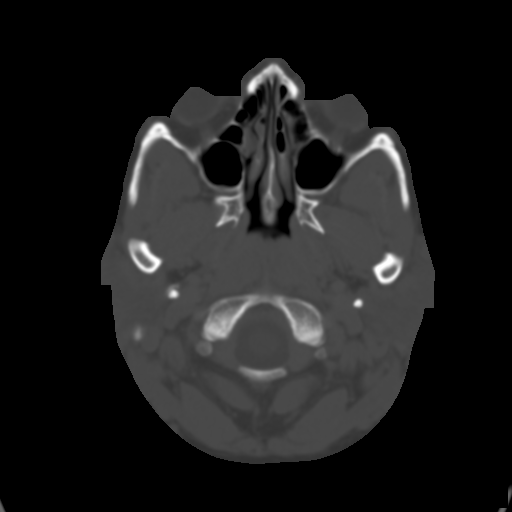
[im 7/34  brain]
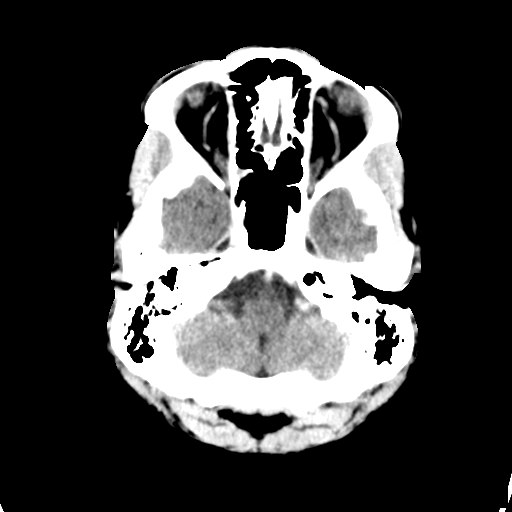
[im 9/34  brain]
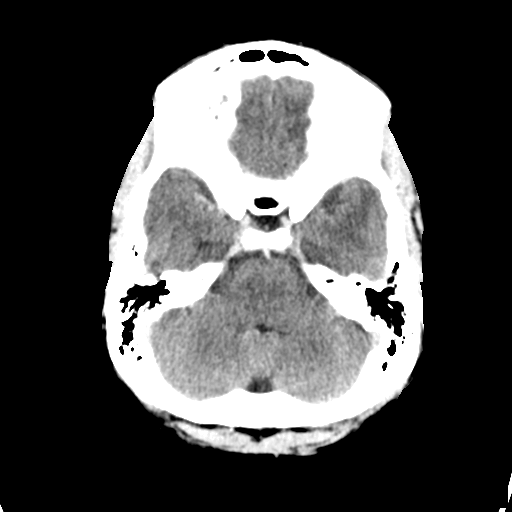
[im 14/34  brain]
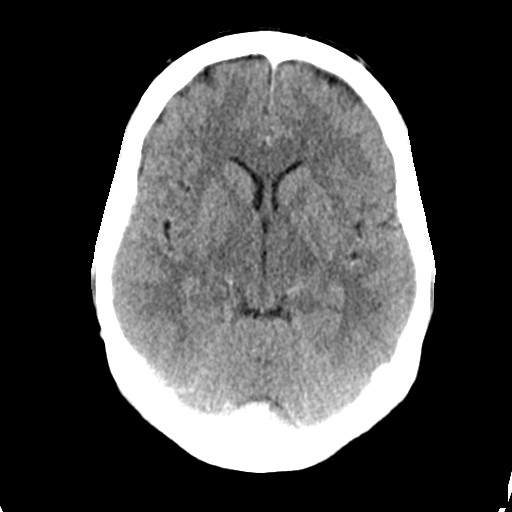
[im 18/34  brain]
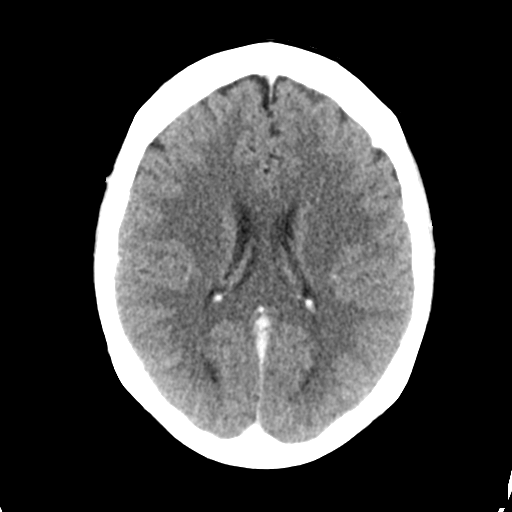
[im 18/34  bone]
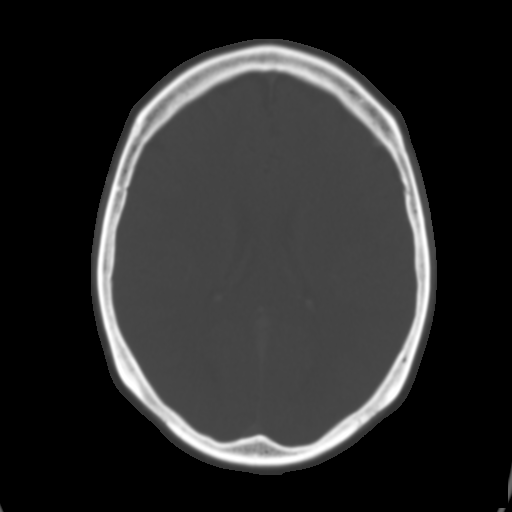
[im 20/34  brain]
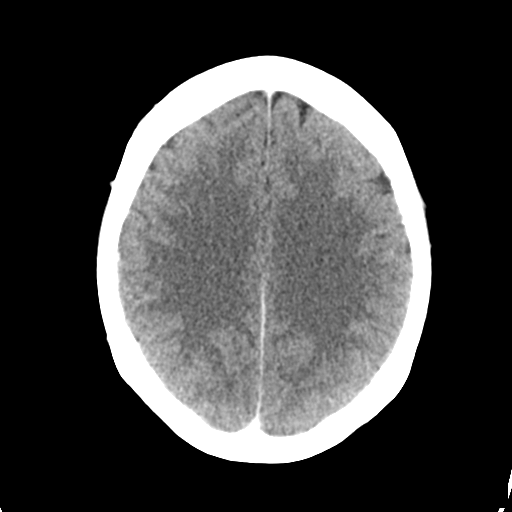
[im 25/34  brain]
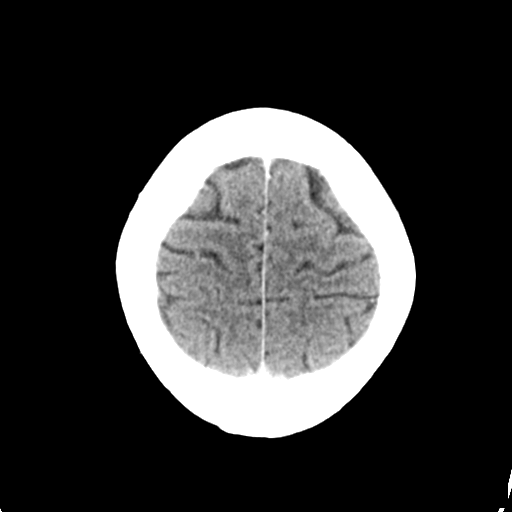
[im 27/34  brain]
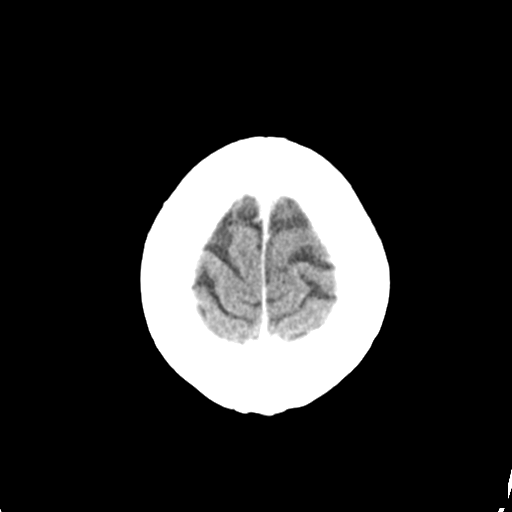
[im 31/34  brain]
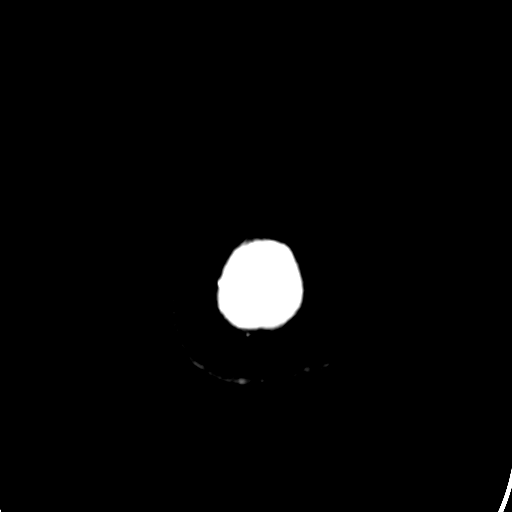
[im 31/34  bone]
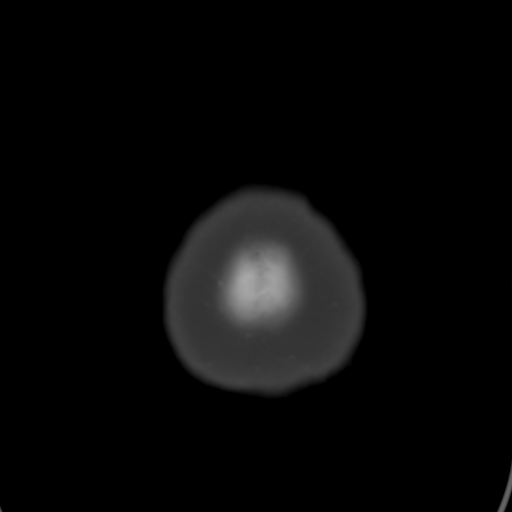

[Series 4: coronal soft tissue · coronal · 0.32mm/px · 3 of 68 slices shown]
[im 23/68  brain]
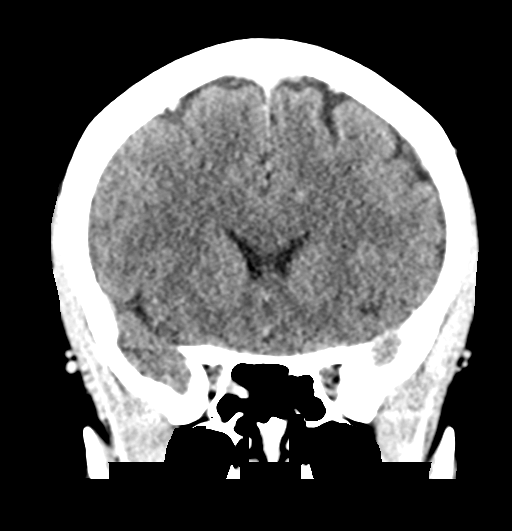
[im 30/68  brain]
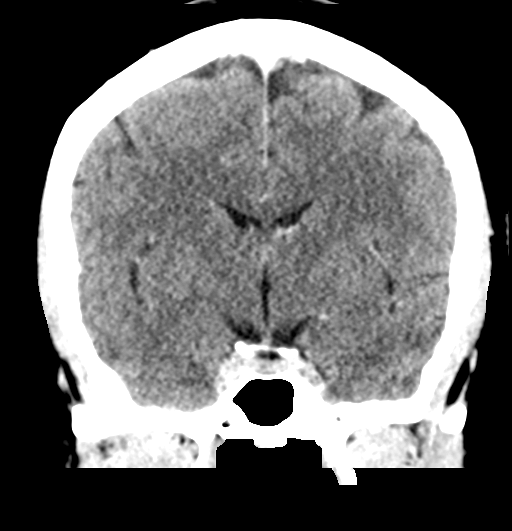
[im 38/68  brain]
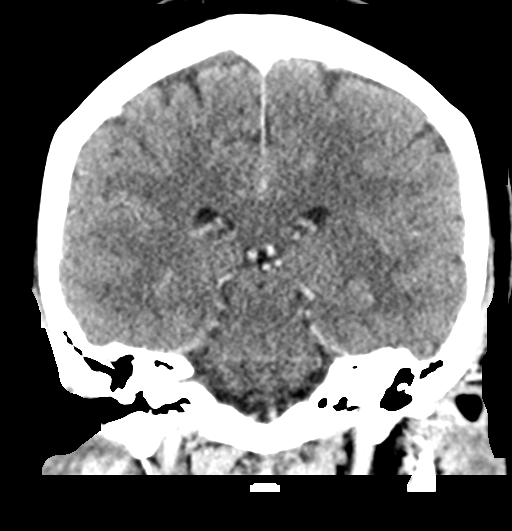

[Series 5: sagittal soft tissue · sagittal · 0.33mm/px · 3 of 55 slices shown]
[im 19/55  brain]
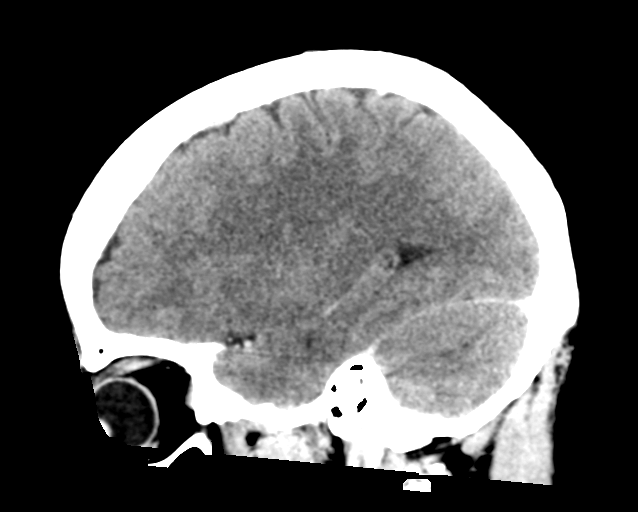
[im 28/55  brain]
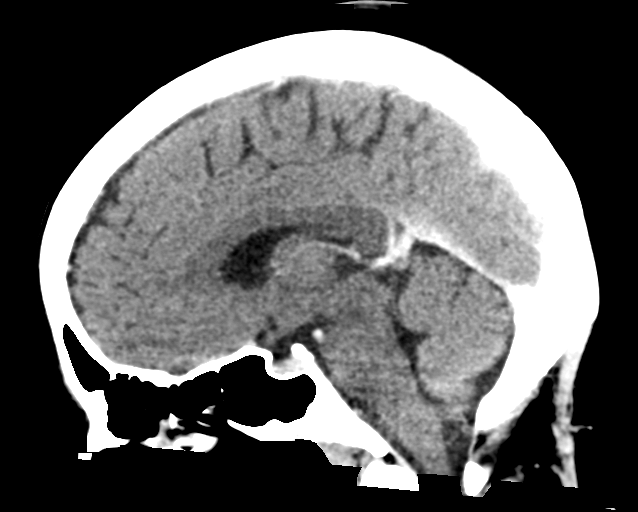
[im 37/55  brain]
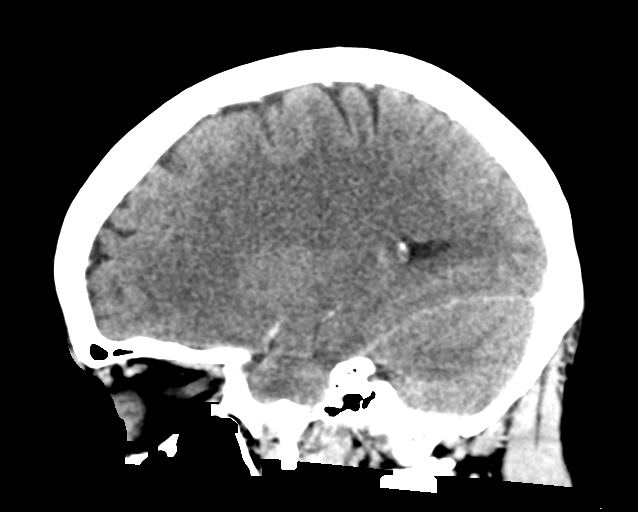

[15 of 47 positions shown; findings below may reference images not displayed]

FINDINGS: Brain: Normal cerebral volume. No midline shift, ventriculomegaly,
mass effect, evidence of mass lesion, intracranial hemorrhage or
evidence of cortically based acute infarction. Gray-white matter
differentiation is within normal limits throughout the brain. No
abnormal enhancement identified.

Vascular: The major intracranial vascular structures are enhancing
and appear patent, including the sigmoid dural venous sinuses.

Skull: Negative.

Sinuses/Orbits: The mild left mastoid fluid demonstrated by MRI last
month is less apparent and may have regressed. There is no
associated mastoid coalescence or temporal bone erosion. Minimal
contralateral right inferior mastoid air cell fluid is stable since
1553. The tympanic cavities remain clear.

Visualized paranasal sinuses are stable and well pneumatized.

Other: Visualized orbits and scalp soft tissues are within normal
limits.
IMPRESSION: 1. Normal CT appearance of the brain. No explanation for progressive
headaches.

2. The mild left mastoid fluid seen on 09/01/18 may have regressed,
but regardless this was more consistent with a postinflammatory mild
effusion rather than a middle ear or mastoid infection - and as such
clinical significance is doubtful.

## 2018-10-08 ENCOUNTER — Ambulatory Visit (INDEPENDENT_AMBULATORY_CARE_PROVIDER_SITE_OTHER): Payer: 59 | Admitting: Otolaryngology

## 2018-10-08 DIAGNOSIS — H6983 Other specified disorders of Eustachian tube, bilateral: Secondary | ICD-10-CM | POA: Diagnosis not present

## 2018-10-08 DIAGNOSIS — H93293 Other abnormal auditory perceptions, bilateral: Secondary | ICD-10-CM

## 2018-10-08 DIAGNOSIS — H7201 Central perforation of tympanic membrane, right ear: Secondary | ICD-10-CM

## 2018-10-08 DIAGNOSIS — H6121 Impacted cerumen, right ear: Secondary | ICD-10-CM | POA: Diagnosis not present

## 2018-10-09 ENCOUNTER — Other Ambulatory Visit: Payer: 59 | Admitting: Obstetrics & Gynecology

## 2018-10-23 ENCOUNTER — Encounter (INDEPENDENT_AMBULATORY_CARE_PROVIDER_SITE_OTHER): Payer: Self-pay

## 2018-10-23 ENCOUNTER — Encounter: Payer: Self-pay | Admitting: Obstetrics & Gynecology

## 2018-10-23 ENCOUNTER — Ambulatory Visit (INDEPENDENT_AMBULATORY_CARE_PROVIDER_SITE_OTHER): Payer: 59 | Admitting: Obstetrics & Gynecology

## 2018-10-23 ENCOUNTER — Other Ambulatory Visit: Payer: Self-pay

## 2018-10-23 VITALS — BP 145/90 | HR 63 | Ht 63.0 in | Wt 160.0 lb

## 2018-10-23 DIAGNOSIS — Z01419 Encounter for gynecological examination (general) (routine) without abnormal findings: Secondary | ICD-10-CM

## 2018-10-23 DIAGNOSIS — Z13220 Encounter for screening for lipoid disorders: Secondary | ICD-10-CM

## 2018-10-23 DIAGNOSIS — Z131 Encounter for screening for diabetes mellitus: Secondary | ICD-10-CM

## 2018-10-23 DIAGNOSIS — Z1329 Encounter for screening for other suspected endocrine disorder: Secondary | ICD-10-CM

## 2018-10-23 MED ORDER — ESTRADIOL 2 MG PO TABS: 2.0000 mg | ORAL_TABLET | Freq: Every day | ORAL | 11 refills | Status: DC

## 2018-10-23 MED ORDER — ESTROGENS, CONJUGATED 0.625 MG/GM VA CREA: TOPICAL_CREAM | VAGINAL | 12 refills | Status: DC

## 2018-10-23 NOTE — Progress Notes (Signed)
Subjective:     Stacy Holder is a 47 y.o. female here for a routine exam.  No LMP recorded. Patient has had a hysterectomy. [id number] Birth Control Method:  hysterectomy Menstrual Calendar(currently): none  Current complaints: vaginal dryness.   Current acute medical issues:  none   Recent Gynecologic History No LMP recorded. Patient has had a hysterectomy. Last Pap: ,  n/a Last mammogram: 2019,  normal  Past Medical History:  Diagnosis Date  . BV (bacterial vaginosis) 11/24/2015  . Rectal pressure 11/24/2015  . Rectocele 11/24/2015  . Vaginal discharge 11/24/2015  . Vaginal odor 11/24/2015    Past Surgical History:  Procedure Laterality Date  . ABDOMINAL HYSTERECTOMY    . Hammer Toe Repair Left 05/30/2017   Left #5 Toe  . TUBAL LIGATION      OB History    Gravida  3   Para  2   Term  1   Preterm  1   AB  1   Living  2     SAB      TAB  1   Ectopic      Multiple      Live Births  2           Social History   Socioeconomic History  . Marital status: Single    Spouse name: Not on file  . Number of children: Not on file  . Years of education: Not on file  . Highest education level: Not on file  Occupational History  . Not on file  Social Needs  . Financial resource strain: Not on file  . Food insecurity:    Worry: Not on file    Inability: Not on file  . Transportation needs:    Medical: Not on file    Non-medical: Not on file  Tobacco Use  . Smoking status: Current Every Day Smoker    Packs/day: 0.25    Years: 15.00    Pack years: 3.75    Types: Cigarettes  . Smokeless tobacco: Never Used  Substance and Sexual Activity  . Alcohol use: Yes    Comment: occ  . Drug use: No  . Sexual activity: Yes    Birth control/protection: Surgical    Comment: hyst  Lifestyle  . Physical activity:    Days per week: Not on file    Minutes per session: Not on file  . Stress: Not on file  Relationships  . Social connections:    Talks on  phone: Not on file    Gets together: Not on file    Attends religious service: Not on file    Active member of club or organization: Not on file    Attends meetings of clubs or organizations: Not on file    Relationship status: Not on file  Other Topics Concern  . Not on file  Social History Narrative  . Not on file    Family History  Problem Relation Age of Onset  . Hypertension Mother   . Diabetes Mother   . Kidney failure Father   . Diabetes Father   . Hypertension Sister   . Breast cancer Sister   . Asthma Daughter   . Diabetes Maternal Grandmother   . Alzheimer's disease Paternal Grandmother      Current Outpatient Medications:  .  acetaminophen (TYLENOL) 500 MG tablet, Take 1,000 mg by mouth every 4 (four) hours as needed for mild pain or headache., Disp: , Rfl:  .  estradiol (ESTRACE)  2 MG tablet, Take 1 tablet (2 mg total) by mouth daily., Disp: 30 tablet, Rfl: 11 .  ibuprofen (ADVIL,MOTRIN) 800 MG tablet, Take 1 tablet (800 mg total) by mouth every 8 (eight) hours as needed for headache., Disp: 21 tablet, Rfl: 0 .  metoCLOPramide (REGLAN) 10 MG tablet, Take 1 tablet (10 mg total) by mouth every 6 (six) hours as needed for nausea (and headache symptoms)., Disp: 30 tablet, Rfl: 0 .  conjugated estrogens (PREMARIN) vaginal cream, Use 1 gram nightly, Disp: 30 g, Rfl: 12  Review of Systems  Review of Systems  Constitutional: Negative for fever, chills, weight loss, malaise/fatigue and diaphoresis.  HENT: Negative for hearing loss, ear pain, nosebleeds, congestion, sore throat, neck pain, tinnitus and ear discharge.   Eyes: Negative for blurred vision, double vision, photophobia, pain, discharge and redness.  Respiratory: Negative for cough, hemoptysis, sputum production, shortness of breath, wheezing and stridor.   Cardiovascular: Negative for chest pain, palpitations, orthopnea, claudication, leg swelling and PND.  Gastrointestinal: negative for abdominal pain. Negative  for heartburn, nausea, vomiting, diarrhea, constipation, blood in stool and melena.  Genitourinary: Negative for dysuria, urgency, frequency, hematuria and flank pain.  Musculoskeletal: Negative for myalgias, back pain, joint pain and falls.  Skin: Negative for itching and rash.  Neurological: Negative for dizziness, tingling, tremors, sensory change, speech change, focal weakness, seizures, loss of consciousness, weakness and headaches.  Endo/Heme/Allergies: Negative for environmental allergies and polydipsia. Does not bruise/bleed easily.  Psychiatric/Behavioral: Negative for depression, suicidal ideas, hallucinations, memory loss and substance abuse. The patient is not nervous/anxious and does not have insomnia.        Objective:  Blood pressure (!) 145/90, pulse 63, height '5\' 3"'  (1.6 m), weight 160 lb (72.6 kg).   Physical Exam  Vitals reviewed. Constitutional: She is oriented to person, place, and time. She appears well-developed and well-nourished.  HENT:  Head: Normocephalic and atraumatic.        Right Ear: External ear normal.  Left Ear: External ear normal.  Nose: Nose normal.  Mouth/Throat: Oropharynx is clear and moist.  Eyes: Conjunctivae and EOM are normal. Pupils are equal, round, and reactive to light. Right eye exhibits no discharge. Left eye exhibits no discharge. No scleral icterus.  Neck: Normal range of motion. Neck supple. No tracheal deviation present. No thyromegaly present.  Cardiovascular: Normal rate, regular rhythm, normal heart sounds and intact distal pulses.  Exam reveals no gallop and no friction rub.   No murmur heard. Respiratory: Effort normal and breath sounds normal. No respiratory distress. She has no wheezes. She has no rales. She exhibits no tenderness.  GI: Soft. Bowel sounds are normal. She exhibits no distension and no mass. There is no tenderness. There is no rebound and no guarding.  Genitourinary:  Breasts no masses skin changes or nipple  changes bilaterally      Vulva is normal without lesions Vagina is pink moist without discharge Cervix absent Uterus is absent Adnexa is negative    Musculoskeletal: Normal range of motion. She exhibits no edema and no tenderness.  Neurological: She is alert and oriented to person, place, and time. She has normal reflexes. She displays normal reflexes. No cranial nerve deficit. She exhibits normal muscle tone. Coordination normal.  Skin: Skin is warm and dry. No rash noted. No erythema. No pallor.  Psychiatric: She has a normal mood and affect. Her behavior is normal. Judgment and thought content normal.       Medications Ordered at today's visit: Meds  ordered this encounter  Medications  . estradiol (ESTRACE) 2 MG tablet    Sig: Take 1 tablet (2 mg total) by mouth daily.    Dispense:  30 tablet    Refill:  11  . conjugated estrogens (PREMARIN) vaginal cream    Sig: Use 1 gram nightly    Dispense:  30 g    Refill:  12    Other orders placed at today's visit: Orders Placed This Encounter  Procedures  . CBC  . Comp Met (CMET)  . HgB A1c  . TSH  . Lipid Profile  . Vitamin D (25 hydroxy)      Assessment:    Healthy female exam.    Plan:    Hormone replacement therapy: hormone replacement therapy: estradiol 2 mg and 4 samples given. Mammogram ordered. Follow up in: 1 year.   premarin cream   Return in about 2 years (around 10/23/2020) for Follow up, with Dr Elonda Husky.

## 2018-10-24 LAB — COMPREHENSIVE METABOLIC PANEL
ALBUMIN: 4.5 g/dL (ref 3.5–5.5)
ALT: 13 IU/L (ref 0–32)
AST: 15 IU/L (ref 0–40)
Albumin/Globulin Ratio: 2.1 (ref 1.2–2.2)
Alkaline Phosphatase: 63 IU/L (ref 39–117)
BUN / CREAT RATIO: 9 (ref 9–23)
BUN: 9 mg/dL (ref 6–24)
Bilirubin Total: 0.4 mg/dL (ref 0.0–1.2)
CO2: 24 mmol/L (ref 20–29)
CREATININE: 1 mg/dL (ref 0.57–1.00)
Calcium: 9.5 mg/dL (ref 8.7–10.2)
Chloride: 105 mmol/L (ref 96–106)
GFR calc non Af Amer: 67 mL/min/{1.73_m2} (ref >59)
GFR, EST AFRICAN AMERICAN: 77 mL/min/{1.73_m2} (ref >59)
GLUCOSE: 95 mg/dL (ref 65–99)
Globulin, Total: 2.1 g/dL (ref 1.5–4.5)
Potassium: 4.3 mmol/L (ref 3.5–5.2)
Sodium: 142 mmol/L (ref 134–144)
TOTAL PROTEIN: 6.6 g/dL (ref 6.0–8.5)

## 2018-10-24 LAB — LIPID PANEL
Chol/HDL Ratio: 2.1 ratio (ref 0.0–4.4)
Cholesterol, Total: 190 mg/dL (ref 100–199)
HDL: 89 mg/dL (ref >39)
LDL CALC: 88 mg/dL (ref 0–99)
TRIGLYCERIDES: 63 mg/dL (ref 0–149)
VLDL Cholesterol Cal: 13 mg/dL (ref 5–40)

## 2018-10-24 LAB — CBC
HEMATOCRIT: 41.7 % (ref 34.0–46.6)
HEMOGLOBIN: 13.5 g/dL (ref 11.1–15.9)
MCH: 29.7 pg (ref 26.6–33.0)
MCHC: 32.4 g/dL (ref 31.5–35.7)
MCV: 92 fL (ref 79–97)
PLATELETS: 300 10*3/uL (ref 150–450)
RBC: 4.55 x10E6/uL (ref 3.77–5.28)
RDW: 11.8 % — ABNORMAL LOW (ref 12.3–15.4)
WBC: 7.1 10*3/uL (ref 3.4–10.8)

## 2018-10-24 LAB — TSH: TSH: 0.972 u[IU]/mL (ref 0.450–4.500)

## 2018-10-24 LAB — HEMOGLOBIN A1C
Est. average glucose Bld gHb Est-mCnc: 100 mg/dL
HEMOGLOBIN A1C: 5.1 % (ref 4.8–5.6)

## 2018-10-24 LAB — VITAMIN D 25 HYDROXY (VIT D DEFICIENCY, FRACTURES): Vit D, 25-Hydroxy: 10.6 ng/mL — ABNORMAL LOW (ref 30.0–100.0)

## 2018-11-19 ENCOUNTER — Ambulatory Visit (INDEPENDENT_AMBULATORY_CARE_PROVIDER_SITE_OTHER): Payer: 59 | Admitting: Otolaryngology

## 2018-11-25 ENCOUNTER — Telehealth: Payer: Self-pay | Admitting: Obstetrics & Gynecology

## 2018-11-25 MED ORDER — METRONIDAZOLE 0.75 % VA GEL: VAGINAL | 0 refills | Status: DC

## 2018-11-25 NOTE — Telephone Encounter (Signed)
Pt called requesting a medication be sent in for BV. She states that she has had this several times before. She c/o fishy odor, discharge and irritation. Advised pt that I would send her request to a provider and she could check with her pharmacy in the next 24 hours. Pt verbalized understanding.

## 2018-11-25 NOTE — Telephone Encounter (Signed)
Metrogel e prescribed  

## 2018-11-25 NOTE — Telephone Encounter (Signed)
Pt needs to know if she can get a Flagel / cream called into [facility] on [address] for a yeast infection

## 2018-11-25 NOTE — Telephone Encounter (Signed)
Informed pt that metrogel rx had been sent to her pharmacy. Pt verbalized understanding.

## 2019-01-14 ENCOUNTER — Ambulatory Visit (INDEPENDENT_AMBULATORY_CARE_PROVIDER_SITE_OTHER): Payer: 59 | Admitting: Women's Health

## 2019-01-14 ENCOUNTER — Encounter: Payer: Self-pay | Admitting: Women's Health

## 2019-01-14 VITALS — BP 131/81 | HR 64 | Ht 63.0 in | Wt 161.6 lb

## 2019-01-14 DIAGNOSIS — B373 Candidiasis of vulva and vagina: Secondary | ICD-10-CM

## 2019-01-14 DIAGNOSIS — N898 Other specified noninflammatory disorders of vagina: Secondary | ICD-10-CM | POA: Diagnosis not present

## 2019-01-14 DIAGNOSIS — B3731 Acute candidiasis of vulva and vagina: Secondary | ICD-10-CM

## 2019-01-14 LAB — POCT WET PREP (WET MOUNT)
CLUE CELLS WET PREP WHIFF POC: NEGATIVE
TRICHOMONAS WET PREP HPF POC: ABSENT

## 2019-01-14 MED ORDER — FLUCONAZOLE 150 MG PO TABS: 150.0000 mg | ORAL_TABLET | Freq: Once | ORAL | 0 refills | Status: AC

## 2019-01-14 MED ORDER — METRONIDAZOLE 0.75 % VA GEL: VAGINAL | 0 refills | Status: DC

## 2019-01-14 NOTE — Progress Notes (Signed)
GYN VISIT Patient name: Stacy Holder MRN [id number]  Date of birth: 07/16/1971 Chief Complaint:   Vaginal Discharge  History of Present Illness:   Stacy Holder is a 48 y.o. [id number] African American female being seen today for report of vaginal d/c w/ odor, itching/irritation x few days. Tried yeast cream x 1 and it burned so stopped. Tried metrogel one night, then stopped b/c she was coming here to get checked out.  Reports black stool on Mon/Tues, took Pepto on Sunday.   No LMP recorded. Patient has had a hysterectomy. The current method of family planning is status post hysterectomy. Last pap >2634yrs ago. Results were:  normal Review of Systems:   Pertinent items are noted in HPI Denies fever/chills, dizziness, headaches, visual disturbances, fatigue, shortness of breath, chest pain, abdominal pain, vomiting, abnormal vaginal discharge/itching/odor/irritation, problems with periods, bowel movements, urination, or intercourse unless otherwise stated above.  Pertinent History Reviewed:  Reviewed past medical,surgical, social, obstetrical and family history.  Reviewed problem list, medications and allergies. Physical Assessment:   Vitals:   01/14/19 1148  BP: 131/81  Pulse: 64  Weight: 161 lb 9.6 oz (73.3 kg)  Height: 5\' 3"  (1.6 m)  Body mass index is 28.63 kg/m.       Physical Examination:   General appearance: alert, well appearing, and in no distress  Mental status: alert, oriented to person, place, and time  Skin: warm & dry   Cardiovascular: normal heart rate noted  Respiratory: normal respiratory effort, no distress  Abdomen: soft, non-tender   Pelvic: VULVA: erythematous vulva with no masses, tenderness or lesions, VAGINA: erythematous vagina with thick clumpy white nonodorous discharge, no lesions  Extremities: no edema   Results for orders placed or performed in visit on 01/14/19 (from the past 24 hour(s))  POCT Wet Prep Mellody Drown([place])   Collection Time: 01/14/19  12:21 PM  Result Value Ref Range   Source Wet Prep POC vaginal    WBC, Wet Prep HPF POC none    Bacteria Wet Prep HPF POC Few Few   BACTERIA WET PREP MORPHOLOGY POC     Clue Cells Wet Prep HPF POC None None   Clue Cells Wet Prep Whiff POC Negative Whiff    Yeast Wet Prep HPF POC Moderate (A) None   KOH Wet Prep POC     Trichomonas Wet Prep HPF POC Absent Absent    Assessment & Plan:  1) Vulvovaginal candida> rx diflucan, refilled metrogel to use prn  2)  [place], if continues notify PCP/GI  Meds:  Meds ordered this encounter  Medications  . fluconazole (DIFLUCAN) 150 MG tablet    Sig: Take 1 tablet (150 mg total) by mouth once for 1 dose. Take 1 pill now, may take 2nd pill in 3 days if needed    Dispense:  2 tablet    Refill:  0    Order Specific Question:   Supervising Provider    Answer:   Despina HiddenEURE, LUTHER H [[id number]]  . metroNIDAZOLE (METROGEL VAGINAL) 0.75 % vaginal gel    Sig: Nightly x 5 nights    Dispense:  70 g    Refill:  0    Order Specific Question:   Supervising Provider    Answer:   Duane LopeEURE, LUTHER H [[id number]]    Orders Placed This Encounter  Procedures  . GC/Chlamydia Probe Amp  . POCT Wet Prep [facility]([place] [place])    Return for after 11/15 for physical.  Cheral MarkerKimberly R Nazarene Bunning CNM, [facility]NP-BC 01/14/2019 12:25 PM

## 2019-01-19 LAB — GC/CHLAMYDIA PROBE AMP
Chlamydia trachomatis, NAA: NEGATIVE
NEISSERIA GONORRHOEAE BY PCR: NEGATIVE

## 2019-04-20 ENCOUNTER — Telehealth: Payer: Self-pay | Admitting: Obstetrics & Gynecology

## 2019-04-20 NOTE — Telephone Encounter (Signed)
Patient called, stated that she thinks she has a yeast infection x 3 days.  [employer]  [phone]

## 2019-04-21 MED ORDER — FLUCONAZOLE 150 MG PO TABS: 150.0000 mg | ORAL_TABLET | Freq: Once | ORAL | 0 refills | Status: AC

## 2019-04-27 ENCOUNTER — Encounter (HOSPITAL_COMMUNITY): Payer: Self-pay

## 2019-04-27 ENCOUNTER — Other Ambulatory Visit: Payer: Self-pay

## 2019-04-27 ENCOUNTER — Emergency Department (HOSPITAL_COMMUNITY)
Admission: EM | Admit: 2019-04-27 | Discharge: 2019-04-28 | Disposition: A | Discharge status: Home / Self Care | Payer: 59 | Attending: Emergency Medicine | Admitting: Emergency Medicine

## 2019-04-27 DIAGNOSIS — Z91040 Latex allergy status: Secondary | ICD-10-CM | POA: Diagnosis not present

## 2019-04-27 DIAGNOSIS — Z79899 Other long term (current) drug therapy: Secondary | ICD-10-CM | POA: Insufficient documentation

## 2019-04-27 DIAGNOSIS — R197 Diarrhea, unspecified: Secondary | ICD-10-CM | POA: Diagnosis present

## 2019-04-27 DIAGNOSIS — F17210 Nicotine dependence, cigarettes, uncomplicated: Secondary | ICD-10-CM | POA: Diagnosis not present

## 2019-04-27 DIAGNOSIS — Z20828 Contact with and (suspected) exposure to other viral communicable diseases: Secondary | ICD-10-CM | POA: Insufficient documentation

## 2019-04-27 LAB — BASIC METABOLIC PANEL
Anion gap: 12 (ref 5–15)
BUN: 12 mg/dL (ref 6–20)
CO2: 26 mmol/L (ref 22–32)
Calcium: 9.3 mg/dL (ref 8.9–10.3)
Chloride: 101 mmol/L (ref 98–111)
Creatinine, Ser: 1.07 mg/dL — ABNORMAL HIGH (ref 0.44–1.00)
GFR calc Af Amer: >60 mL/min (ref >60)
GFR calc non Af Amer: >60 mL/min (ref >60)
Glucose, Bld: 90 mg/dL (ref 70–99)
Potassium: 4.1 mmol/L (ref 3.5–5.1)
Sodium: 139 mmol/L (ref 135–145)

## 2019-04-27 MED ORDER — LOPERAMIDE HCL 2 MG PO CAPS
4.0000 mg | ORAL_CAPSULE | Freq: Once | ORAL | Status: AC
  Administered 2019-04-27: 4 mg via ORAL
  Filled 2019-04-27: qty 2

## 2019-04-27 NOTE — ED Triage Notes (Signed)
Pt reports working at [employer] in [place], persons at work have been tested positive and has been in close proximity to them. Pt cousin has also tested positive and is in hospital now. Pt reports at family members house with cousin on mother's day. Pt reports diarrhea for 4 days now, took pepto bismuth and it slowed down but came back. Pt reports the following sx started 5 days ago; headache, body aches, SOB, wheezing, and coughing. PT says the diarrhea started a day after those symptoms.

## 2019-04-27 NOTE — ED Provider Notes (Signed)
[facility] EMERGENCY DEPARTMENT Provider Note   CSN: [id number] Arrival date & time: 04/27/19  2017    History   Chief Complaint Chief Complaint  Patient presents with  . Diarrhea    body aches, vomting, cough    HPI Stacy Holder is a 48 y.o. female.     HPI   She presents for evaluation of diarrhea for 5 days, multiple loose stools each day, worse today than yesterday.  Stool brown in color.  Some other vague symptoms including nasal congestion, nonproductive cough, nausea, single episode of vomiting, occasional shortness of breath, headache and body aches.  She states that she has been around some people who have diagnosed with Covid-19, at work, and in her personal friendship group.  She denies fever, chills, weakness or dizziness.  She left work today around 2 PM, because of the symptoms.  Her employer's nurse recommended that she come to the ED, this evening.  There are no other known modifying factors.  Past Medical History:  Diagnosis Date  . BV (bacterial vaginosis) 11/24/2015  . Rectal pressure 11/24/2015  . Rectocele 11/24/2015  . Vaginal discharge 11/24/2015  . Vaginal odor 11/24/2015    Patient Active Problem List   Diagnosis Date Noted  . BV (bacterial vaginosis) 11/24/2015  . Rectal pressure 11/24/2015  . Rectocele 11/24/2015    Past Surgical History:  Procedure Laterality Date  . ABDOMINAL HYSTERECTOMY    . Hammer Toe Repair Left 05/30/2017   Left #5 Toe  . TUBAL LIGATION       OB History    Gravida  3   Para  2   Term  1   Preterm  1   AB  1   Living  2     SAB      TAB  1   Ectopic      Multiple      Live Births  2            Home Medications    Prior to Admission medications   Medication Sig Start Date End Date Taking? Authorizing Provider  acetaminophen (TYLENOL) 500 MG tablet Take 500-1,000 mg by mouth every 4 (four) hours as needed for mild pain or headache.    Yes [provider]  bismuth  subsalicylate (PEPTO BISMOL) 262 MG/15ML suspension Take 30 mLs by mouth every 6 (six) hours as needed for indigestion or diarrhea or loose stools.   Yes [provider]  Cholecalciferol (VITAMIN D3) 50 MCG (2000 UT) TABS Take 1 tablet by mouth every morning.   Yes [provider]  estradiol (ESTRACE) 2 MG tablet Take 1 tablet (2 mg total) by mouth daily. 10/23/18  Yes Lazaro Arms, MD  fluconazole (DIFLUCAN) 150 MG tablet Take 150 mg by mouth See admin instructions. Take one tablet on 04/25/2019, then take last tablet on 04/28/2019 as directed   Yes [provider]  ibuprofen (ADVIL,MOTRIN) 800 MG tablet Take 1 tablet (800 mg total) by mouth every 8 (eight) hours as needed for headache. 09/08/18  Yes Long, Arlyss Repress, MD  Multiple Vitamins-Minerals (EMERGEN-C IMMUNE PLUS) PACK Take 1 Package by mouth daily.   Yes [provider]    Family History Family History  Problem Relation Age of Onset  . Hypertension Mother   . Diabetes Mother   . Kidney failure Father   . Diabetes Father   . Hypertension Sister   . Breast cancer Sister   . Asthma Daughter   .  Diabetes Maternal Grandmother   . Alzheimer's disease Paternal Grandmother     Social History Social History   Tobacco Use  . Smoking status: Current Every Day Smoker    Packs/day: 0.25    Years: 15.00    Pack years: 3.75    Types: Cigarettes  . Smokeless tobacco: Never Used  Substance Use Topics  . Alcohol use: Yes    Comment: occ  . Drug use: No     Allergies   Latex and Tape   Review of Systems Review of Systems  All other systems reviewed and are negative.    Physical Exam Updated Vital Signs BP (!) 139/99   Pulse 74   Temp 98.7 F (37.1 C) (Oral)   Resp 17   Ht 5\' 3"  (1.6 m)   Wt 80.7 kg   SpO2 100%   BMI 31.53 kg/m   Physical Exam Vitals signs and nursing note reviewed.  Constitutional:      General: She is not in acute distress.    Appearance: She is well-developed  and normal weight. She is not ill-appearing, toxic-appearing or diaphoretic.  HENT:     Head: Normocephalic and atraumatic.     Right Ear: External ear normal.     Left Ear: External ear normal.     Nose: Nose normal. No congestion or rhinorrhea.     Mouth/Throat:     Pharynx: No oropharyngeal exudate or posterior oropharyngeal erythema.  Eyes:     Conjunctiva/sclera: Conjunctivae normal.     Pupils: Pupils are equal, round, and reactive to light.  Neck:     Musculoskeletal: Normal range of motion and neck supple.     Trachea: Phonation normal.  Cardiovascular:     Rate and Rhythm: Normal rate and regular rhythm.     Heart sounds: Normal heart sounds.  Pulmonary:     Effort: Pulmonary effort is normal.     Breath sounds: Normal breath sounds.  Abdominal:     General: There is no distension.     Palpations: Abdomen is soft.     Tenderness: There is no abdominal tenderness. There is no guarding.  Musculoskeletal: Normal range of motion.  Skin:    General: Skin is warm and dry.  Neurological:     Mental Status: She is alert and oriented to person, place, and time.     Cranial Nerves: No cranial nerve deficit.     Sensory: No sensory deficit.     Motor: No abnormal muscle tone.     Coordination: Coordination normal.  Psychiatric:        Mood and Affect: Mood normal.        Behavior: Behavior normal.        Thought Content: Thought content normal.        Judgment: Judgment normal.      ED Treatments / Results  Labs (all labs ordered are listed, but only abnormal results are displayed) Labs Reviewed  NOVEL CORONAVIRUS, NAA (HOSPITAL ORDER, SEND-OUT TO REF LAB)  BASIC METABOLIC PANEL  CBC WITH DIFFERENTIAL/PLATELET  URINALYSIS, ROUTINE W REFLEX MICROSCOPIC  HIV ANTIBODY (ROUTINE TESTING W REFLEX)    EKG None  Radiology No results found.  Procedures Procedures (including critical care time)  Medications Ordered in ED Medications  loperamide (IMODIUM) capsule 4  mg (4 mg Oral Given 04/27/19 2224)     Initial Impression / Assessment and Plan / ED Course  I have reviewed the triage vital signs and the nursing notes.  Pertinent  labs & imaging results that were available during my care of the patient were reviewed by me and considered in my medical decision making (see chart for details).         Patient Vitals for the past 24 hrs:  BP Temp Temp src Pulse Resp SpO2 Height Weight  04/27/19 2100 (!) 139/99 - - 74 17 100 % - -  04/27/19 2057 - - - - - - 5\' 3"  (1.6 m) 80.7 kg  04/27/19 2052 (!) 147/93 98.7 F (37.1 C) Oral 65 17 99 % - -    11:26 PM Reevaluation with update and discussion. After initial assessment and treatment, an updated evaluation reveals laboratory evaluation is pending.  Care transferred to oncoming provider. Mancel Bale   Medical Decision Making: Specific diarrhea.  Doubt Covid-19 infection.  Patient was directed to come here by her employees nurse.  CRITICAL CARE- No Performed by: Mancel Bale  Nursing Notes Reviewed/ Care Coordinated Applicable Imaging Reviewed Interpretation of Laboratory Data incorporated into ED treatment  Dispo- as per Dr Estell Harpin  Final Clinical Impressions(s) / ED Diagnoses   Final diagnoses:  Diarrhea, unspecified type    ED Discharge Orders    None       Mancel Bale, MD 04/28/19 1151

## 2019-04-28 LAB — CBC WITH DIFFERENTIAL/PLATELET
Abs Immature Granulocytes: 0.03 10*3/uL (ref 0.00–0.07)
Basophils Absolute: 0.1 10*3/uL (ref 0.0–0.1)
Basophils Relative: 1 %
Eosinophils Absolute: 0.1 10*3/uL (ref 0.0–0.5)
Eosinophils Relative: 1 %
HCT: 43.6 % (ref 36.0–46.0)
Hemoglobin: 14.3 g/dL (ref 12.0–15.0)
Immature Granulocytes: 0 %
Lymphocytes Relative: 45 %
Lymphs Abs: 4.7 10*3/uL — ABNORMAL HIGH (ref 0.7–4.0)
MCH: 30.6 pg (ref 26.0–34.0)
MCHC: 32.8 g/dL (ref 30.0–36.0)
MCV: 93.2 fL (ref 80.0–100.0)
Monocytes Absolute: 0.7 10*3/uL (ref 0.1–1.0)
Monocytes Relative: 7 %
Neutro Abs: 4.7 10*3/uL (ref 1.7–7.7)
Neutrophils Relative %: 46 %
Platelets: 291 10*3/uL (ref 150–400)
RBC: 4.68 MIL/uL (ref 3.87–5.11)
RDW: 12.5 % (ref 11.5–15.5)
WBC: 10.2 10*3/uL (ref 4.0–10.5)
nRBC: 0 % (ref 0.0–0.2)

## 2019-04-28 MED ORDER — ONDANSETRON 4 MG PO TBDP: ORAL_TABLET | ORAL | 0 refills | Status: DC

## 2019-04-28 NOTE — Discharge Instructions (Addendum)
Follow up with dr. Sherwood Gambler latter this week,.   Drink plenty of fluids

## 2019-04-29 LAB — NOVEL CORONAVIRUS, NAA (HOSP ORDER, SEND-OUT TO REF LAB; TAT 18-24 HRS): SARS-CoV-2, NAA: NOT DETECTED

## 2019-04-29 LAB — HIV ANTIBODY (ROUTINE TESTING W REFLEX): HIV Screen 4th Generation wRfx: NONREACTIVE

## 2019-05-18 ENCOUNTER — Other Ambulatory Visit: Payer: Self-pay | Admitting: Obstetrics & Gynecology

## 2019-07-14 ENCOUNTER — Ambulatory Visit (INDEPENDENT_AMBULATORY_CARE_PROVIDER_SITE_OTHER): Payer: 59 | Admitting: Women's Health

## 2019-07-14 ENCOUNTER — Encounter: Payer: Self-pay | Admitting: Women's Health

## 2019-07-14 ENCOUNTER — Other Ambulatory Visit: Payer: Self-pay

## 2019-07-14 VITALS — BP 135/80 | HR 70 | Ht 63.0 in | Wt 169.0 lb

## 2019-07-14 DIAGNOSIS — L731 Pseudofolliculitis barbae: Secondary | ICD-10-CM

## 2019-07-14 DIAGNOSIS — N898 Other specified noninflammatory disorders of vagina: Secondary | ICD-10-CM | POA: Diagnosis not present

## 2019-07-14 DIAGNOSIS — R300 Dysuria: Secondary | ICD-10-CM | POA: Diagnosis not present

## 2019-07-14 DIAGNOSIS — N941 Unspecified dyspareunia: Secondary | ICD-10-CM

## 2019-07-14 LAB — POCT URINALYSIS DIPSTICK
Blood, UA: NEGATIVE
Glucose, UA: NEGATIVE
Ketones, UA: NEGATIVE
Leukocytes, UA: NEGATIVE
Nitrite, UA: NEGATIVE
Protein, UA: NEGATIVE

## 2019-07-14 NOTE — Progress Notes (Signed)
   GYN VISIT Patient name: Stacy Holder MRN [id number]  Date of birth: [birth year] Chief Complaint:   vaginal odor (pain with intercourse, burning with urination)  History of Present Illness:   Stacy Holder is a 48 y.o. [phone] African American female being seen today for report of burning w/ urination, had sex Sat for first time since Jan w/ new partner and was very painful. Vaginal odor. Also has ingrown hair Lt inner thigh that is bothering her.      No LMP recorded. Patient has had a hysterectomy. The current method of family planning is status post hysterectomy.  Last pap >65yrs ago. Results were:  normal Review of Systems:   Pertinent items are noted in HPI Denies fever/chills, dizziness, headaches, visual disturbances, fatigue, shortness of breath, chest pain, abdominal pain, vomiting, abnormal vaginal discharge/itching/odor/irritation, problems with periods, bowel movements, urination, or intercourse unless otherwise stated above.  Pertinent History Reviewed:  Reviewed past medical,surgical, social, obstetrical and family history.  Reviewed problem list, medications and allergies. Physical Assessment:   Vitals:   07/14/19 1049  BP: 135/80  Pulse: 70  Weight: 169 lb (76.7 kg)  Height: 5\' 3"  (1.6 m)  Body mass index is 29.94 kg/m.       Physical Examination:   General appearance: alert, well appearing, and in no distress  Mental status: alert, oriented to person, place, and time  Skin: warm & dry   Cardiovascular: normal heart rate noted  Respiratory: normal respiratory effort, no distress  Abdomen: soft, non-tender   Pelvic: VULVA: normal appearing vulva with no masses, tenderness or lesions, VAGINA: normal appearing vagina with normal color and discharge, no lesions, tender to palpation, CERVIX: surgically absent, UTERUS: surgically absent, ADNEXA: no tenderness/masses  Extremities: Lt inner upper thigh- small boil/ingrown hair, hair teezed out w/ tweezers then long  coiled up hair removed  Results for orders placed or performed in visit on 07/14/19 (from the past 24 hour(s))  POCT Urinalysis Dipstick   Collection Time: 07/14/19 11:27 AM  Result Value Ref Range   Color, UA     Clarity, UA     Glucose, UA Negative Negative   Bilirubin, UA     Ketones, UA neg    Spec Grav, UA     Blood, UA neg    pH, UA     Protein, UA Negative Negative   Urobilinogen, UA     Nitrite, UA neg    Leukocytes, UA Negative Negative   Appearance     Odor      Assessment & Plan:  1) Dysuria> send urine cx  2) Dyspareunia> could be from infrequent sex  3) Vaginal odor> nuswab sent  4) Ingrown hair removal  Meds: No orders of the defined types were placed in this encounter.   Orders Placed This Encounter  Procedures  . Urine Culture  . NuSwab Vaginitis Plus (VG+)  . POCT Urinalysis Dipstick    Return for after 11/15 for physical.  Roma Schanz CNM, [facility] 07/14/2019 1:36 PM

## 2019-07-16 LAB — URINE CULTURE

## 2019-07-16 LAB — SPECIMEN STATUS REPORT

## 2019-07-17 LAB — NUSWAB VAGINITIS PLUS (VG+)
Candida albicans, NAA: NEGATIVE
Candida glabrata, NAA: NEGATIVE
Chlamydia trachomatis, NAA: NEGATIVE
Neisseria gonorrhoeae, NAA: NEGATIVE
Trich vag by NAA: NEGATIVE

## 2019-07-17 LAB — SPECIMEN STATUS REPORT

## 2019-10-25 ENCOUNTER — Other Ambulatory Visit: Payer: 59 | Admitting: Adult Health

## 2019-11-22 ENCOUNTER — Other Ambulatory Visit: Payer: 59 | Admitting: Obstetrics & Gynecology

## 2019-12-13 ENCOUNTER — Other Ambulatory Visit: Payer: Self-pay

## 2019-12-13 ENCOUNTER — Encounter: Payer: Self-pay | Admitting: Obstetrics & Gynecology

## 2019-12-13 ENCOUNTER — Ambulatory Visit (INDEPENDENT_AMBULATORY_CARE_PROVIDER_SITE_OTHER): Payer: BC Managed Care – PPO | Admitting: Obstetrics & Gynecology

## 2019-12-13 VITALS — BP 130/85 | HR 76 | Ht 63.0 in | Wt 180.0 lb

## 2019-12-13 DIAGNOSIS — Z01419 Encounter for gynecological examination (general) (routine) without abnormal findings: Secondary | ICD-10-CM

## 2019-12-13 MED ORDER — ESTRADIOL 2 MG PO TABS: 2.0000 mg | ORAL_TABLET | Freq: Every day | ORAL | 3 refills | Status: DC

## 2019-12-13 MED ORDER — SILVER SULFADIAZINE 1 % EX CREA: TOPICAL_CREAM | CUTANEOUS | 11 refills | Status: DC

## 2019-12-13 NOTE — Progress Notes (Signed)
Subjective:     Stacy Holder is a 49 y.o. female here for a routine exam.  No LMP recorded. Patient has had a hysterectomy. [id number] Birth Control Method:  hysterectomy Menstrual Calendar(currently): hysterectomy  Current complaints: none.   Current acute medical issues:  Breast bumps probably staph   Recent Gynecologic History No LMP recorded. Patient has had a hysterectomy. Last Pap: years ago, n/a,   Last mammogram: 2019,  normal  Past Medical History:  Diagnosis Date  . BV (bacterial vaginosis) 11/24/2015  . Rectal pressure 11/24/2015  . Rectocele 11/24/2015  . Vaginal discharge 11/24/2015  . Vaginal odor 11/24/2015    Past Surgical History:  Procedure Laterality Date  . ABDOMINAL HYSTERECTOMY    . Hammer Toe Repair Left 05/30/2017   Left #5 Toe  . TUBAL LIGATION      OB History    Gravida  3   Para  2   Term  1   Preterm  1   AB  1   Living  2     SAB      TAB  1   Ectopic      Multiple      Live Births  2           Social History   Socioeconomic History  . Marital status: Single    Spouse name: Not on file  . Number of children: 2  . Years of education: Not on file  . Highest education level: Not on file  Occupational History  . Not on file  Tobacco Use  . Smoking status: Former Smoker    Packs/day: 0.25    Years: 15.00    Pack years: 3.75    Types: Cigarettes    Quit date: 10/08/2019    Years since quitting: 0.1  . Smokeless tobacco: Never Used  Substance and Sexual Activity  . Alcohol use: Yes    Comment: occ  . Drug use: No  . Sexual activity: Yes    Birth control/protection: Surgical    Comment: hyst  Other Topics Concern  . Not on file  Social History Narrative  . Not on file   Social Determinants of Health   Financial Resource Strain:   . Difficulty of Paying Living Expenses: Not on file  Food Insecurity:   . Worried About Programme researcher, broadcasting/film/video in the Last Year: Not on file  . Ran Out of Food in the Last  Year: Not on file  Transportation Needs:   . Lack of Transportation (Medical): Not on file  . Lack of Transportation (Non-Medical): Not on file  Physical Activity:   . Days of Exercise per Week: Not on file  . Minutes of Exercise per Session: Not on file  Stress:   . Feeling of Stress : Not on file  Social Connections:   . Frequency of Communication with Friends and Family: Not on file  . Frequency of Social Gatherings with Friends and Family: Not on file  . Attends Religious Services: Not on file  . Active Member of Clubs or Organizations: Not on file  . Attends Banker Meetings: Not on file  . Marital Status: Not on file    Family History  Problem Relation Age of Onset  . Hypertension Mother   . Diabetes Mother   . Kidney failure Father   . Diabetes Father   . Hypertension Sister   . Breast cancer Sister   . Asthma Daughter   . Diabetes  Maternal Grandmother   . Alzheimer's disease Paternal Grandmother      Current Outpatient Medications:  .  acetaminophen (TYLENOL) 500 MG tablet, Take 500-1,000 mg by mouth every 4 (four) hours as needed for mild pain or headache. , Disp: , Rfl:  .  Cholecalciferol (VITAMIN D3) 50 MCG (2000 UT) TABS, Take 1 tablet by mouth every morning., Disp: , Rfl:  .  estradiol (ESTRACE) 2 MG tablet, Take 1 tablet (2 mg total) by mouth daily., Disp: 90 tablet, Rfl: 3 .  Multiple Vitamins-Minerals (EMERGEN-C IMMUNE PLUS) PACK, Take 1 Package by mouth daily., Disp: , Rfl:  .  bismuth subsalicylate (PEPTO BISMOL) 262 MG/15ML suspension, Take 30 mLs by mouth every 6 (six) hours as needed for indigestion or diarrhea or loose stools., Disp: , Rfl:  .  ibuprofen (ADVIL,MOTRIN) 800 MG tablet, Take 1 tablet (800 mg total) by mouth every 8 (eight) hours as needed for headache. (Patient not taking: Reported on 07/14/2019), Disp: 21 tablet, Rfl: 0 .  silver sulfADIAZINE (SILVADENE) 1 % cream, Use to area 2-3 times daily, Disp: 50 g, Rfl: 11  Review of  Systems  Review of Systems  Constitutional: Negative for fever, chills, weight loss, malaise/fatigue and diaphoresis.  HENT: Negative for hearing loss, ear pain, nosebleeds, congestion, sore throat, neck pain, tinnitus and ear discharge.   Eyes: Negative for blurred vision, double vision, photophobia, pain, discharge and redness.  Respiratory: Negative for cough, hemoptysis, sputum production, shortness of breath, wheezing and stridor.   Cardiovascular: Negative for chest pain, palpitations, orthopnea, claudication, leg swelling and PND.  Gastrointestinal: negative for abdominal pain. Negative for heartburn, nausea, vomiting, diarrhea, constipation, blood in stool and melena.  Genitourinary: Negative for dysuria, urgency, frequency, hematuria and flank pain.  Musculoskeletal: Negative for myalgias, back pain, joint pain and falls.  Skin: Negative for itching and rash.  Neurological: Negative for dizziness, tingling, tremors, sensory change, speech change, focal weakness, seizures, loss of consciousness, weakness and headaches.  Endo/Heme/Allergies: Negative for environmental allergies and polydipsia. Does not bruise/bleed easily.  Psychiatric/Behavioral: Negative for depression, suicidal ideas, hallucinations, memory loss and substance abuse. The patient is not nervous/anxious and does not have insomnia.        Objective:  Blood pressure 130/85, pulse 76, height 5\' 3"  (1.6 m), weight 180 lb (81.6 kg).   Physical Exam  Vitals reviewed. Constitutional: She is oriented to person, place, and time. She appears well-developed and well-nourished.  HENT:  Head: Normocephalic and atraumatic.        Right Ear: External ear normal.  Left Ear: External ear normal.  Nose: Nose normal.  Mouth/Throat: Oropharynx is clear and moist.  Eyes: Conjunctivae and EOM are normal. Pupils are equal, round, and reactive to light. Right eye exhibits no discharge. Left eye exhibits no discharge. No scleral icterus.   Neck: Normal range of motion. Neck supple. No tracheal deviation present. No thyromegaly present.  Cardiovascular: Normal rate, regular rhythm, normal heart sounds and intact distal pulses.  Exam reveals no gallop and no friction rub.   No murmur heard. Respiratory: Effort normal and breath sounds normal. No respiratory distress. She has no wheezes. She has no rales. She exhibits no tenderness.  GI: Soft. Bowel sounds are normal. She exhibits no distension and no mass. There is no tenderness. There is no rebound and no guarding.  Genitourinary:  Breasts no masses skin changes or nipple changes bilaterally      Vulva is normal without lesions Vagina is pink moist without discharge Cervix  surgically absent Uterus is absent Adnexa is negative ovaries are normal  Musculoskeletal: Normal range of motion. She exhibits no edema and no tenderness.  Neurological: She is alert and oriented to person, place, and time. She has normal reflexes. She displays normal reflexes. No cranial nerve deficit. She exhibits normal muscle tone. Coordination normal.  Skin: Skin is warm and dry. No rash noted. No erythema. No pallor.  Psychiatric: She has a normal mood and affect. Her behavior is normal. Judgment and thought content normal.       Medications Ordered at today's visit: Meds ordered this encounter  Medications  . estradiol (ESTRACE) 2 MG tablet    Sig: Take 1 tablet (2 mg total) by mouth daily.    Dispense:  90 tablet    Refill:  3  . silver sulfADIAZINE (SILVADENE) 1 % cream    Sig: Use to area 2-3 times daily    Dispense:  50 g    Refill:  11    Other orders placed at today's visit: No orders of the defined types were placed in this encounter.     Assessment:    Healthy female exam.    Plan:    Hormone replacement therapy: hormone replacement therapy: estradiol 2 mg daily. Mammogram ordered. Follow up in: 1 year.     No follow-ups on file.

## 2020-05-07 ENCOUNTER — Other Ambulatory Visit: Payer: Self-pay

## 2020-05-07 ENCOUNTER — Ambulatory Visit
Admission: EM | Admit: 2020-05-07 | Discharge: 2020-05-07 | Disposition: A | Discharge status: Home / Self Care | Payer: BC Managed Care – PPO | Attending: Emergency Medicine | Admitting: Emergency Medicine

## 2020-05-07 ENCOUNTER — Encounter: Payer: Self-pay | Admitting: Emergency Medicine

## 2020-05-07 DIAGNOSIS — R509 Fever, unspecified: Secondary | ICD-10-CM | POA: Insufficient documentation

## 2020-05-07 DIAGNOSIS — R6889 Other general symptoms and signs: Secondary | ICD-10-CM | POA: Insufficient documentation

## 2020-05-07 DIAGNOSIS — Z20822 Contact with and (suspected) exposure to covid-19: Secondary | ICD-10-CM

## 2020-05-07 LAB — POCT RAPID STREP A (OFFICE): Rapid Strep A Screen: NEGATIVE

## 2020-05-07 MED ORDER — AMOXICILLIN-POT CLAVULANATE 875-125 MG PO TABS: 1.0000 | ORAL_TABLET | Freq: Two times a day (BID) | ORAL | 0 refills | Status: AC

## 2020-05-07 MED ORDER — BENZONATATE 100 MG PO CAPS: 100.0000 mg | ORAL_CAPSULE | Freq: Three times a day (TID) | ORAL | 0 refills | Status: DC

## 2020-05-07 MED ORDER — CETIRIZINE HCL 10 MG PO TABS: 10.0000 mg | ORAL_TABLET | Freq: Every day | ORAL | 0 refills | Status: DC

## 2020-05-07 MED ORDER — FLUTICASONE PROPIONATE 50 MCG/ACT NA SUSP: 2.0000 | Freq: Every day | NASAL | 0 refills | Status: DC

## 2020-05-07 NOTE — ED Triage Notes (Addendum)
Sore throat, draining in throat, chills, headache, diarrhea, fever nad fatigue that started 4-5 days ago.  Had rapid covid test on Friday that was neg.  Was told to get tested again if she didn't feel any better in a couple of days.  Has not taken any tylenol for the fever since last night.

## 2020-05-07 NOTE — ED Provider Notes (Signed)
[facility]   [id number] 05/07/20 Arrival Time: 0102   CC: COVID symptoms  SUBJECTIVE: History from: patient.  Stacy Holder is a 49 y.o. female who presents with ear pain, sore throat, PND, chills, HA, diarrhea, fever, and fatigue x 4-5 days.  Denies sick exposure to COVID, flu or strep.  Had a negative rapid COVID.  Has tried OTC medications without relief.  Symptoms are made worse at night.  Reports previous symptoms in the past.   Denies fever, SOB, wheezing, chest pain, nausea, vomiting, changes in bowel or bladder habits.    ROS: As per HPI.  All other pertinent ROS negative.     Past Medical History:  Diagnosis Date  . BV (bacterial vaginosis) 11/24/2015  . Rectal pressure 11/24/2015  . Rectocele 11/24/2015  . Vaginal discharge 11/24/2015  . Vaginal odor 11/24/2015   Past Surgical History:  Procedure Laterality Date  . ABDOMINAL HYSTERECTOMY    . Hammer Toe Repair Left 05/30/2017   Left #5 Toe  . TUBAL LIGATION     Allergies  Allergen Reactions  . Latex Other (See Comments)    blisters  . Tape Rash    Cause a burn   No current facility-administered medications on file prior to encounter.   Current Outpatient Medications on File Prior to Encounter  Medication Sig Dispense Refill  . acetaminophen (TYLENOL) 500 MG tablet Take 500-1,000 mg by mouth every 4 (four) hours as needed for mild pain or headache.     . bismuth subsalicylate (PEPTO BISMOL) 262 MG/15ML suspension Take 30 mLs by mouth every 6 (six) hours as needed for indigestion or diarrhea or loose stools.    . Cholecalciferol (VITAMIN D3) 50 MCG (2000 UT) TABS Take 1 tablet by mouth every morning.    Marland Kitchen estradiol (ESTRACE) 2 MG tablet Take 1 tablet (2 mg total) by mouth daily. 90 tablet 3  . ibuprofen (ADVIL,MOTRIN) 800 MG tablet Take 1 tablet (800 mg total) by mouth every 8 (eight) hours as needed for headache. (Patient not taking: Reported on 07/14/2019) 21 tablet 0  . Multiple Vitamins-Minerals  (EMERGEN-C IMMUNE PLUS) PACK Take 1 Package by mouth daily.    . silver sulfADIAZINE (SILVADENE) 1 % cream Use to area 2-3 times daily 50 g 11   Social History   Socioeconomic History  . Marital status: Single    Spouse name: Not on file  . Number of children: 2  . Years of education: Not on file  . Highest education level: Not on file  Occupational History  . Not on file  Tobacco Use  . Smoking status: Former Smoker    Packs/day: 0.25    Years: 15.00    Pack years: 3.75    Types: Cigarettes    Quit date: 10/08/2019    Years since quitting: 0.5  . Smokeless tobacco: Never Used  Substance and Sexual Activity  . Alcohol use: Yes    Comment: occ  . Drug use: No  . Sexual activity: Yes    Birth control/protection: Surgical    Comment: hyst  Other Topics Concern  . Not on file  Social History Narrative  . Not on file   Social Determinants of Health   Financial Resource Strain:   . Difficulty of Paying Living Expenses:   Food Insecurity:   . Worried About Charity fundraiser in the Last Year:   . [place] in the Last Year:   Transportation Needs:   . Lack of  Transportation (Medical):   Marland Kitchen Lack of Transportation (Non-Medical):   Physical Activity:   . Days of Exercise per Week:   . Minutes of Exercise per Session:   Stress:   . Feeling of Stress :   Social Connections:   . Frequency of Communication with Friends and Family:   . Frequency of Social Gatherings with Friends and Family:   . Attends Religious Services:   . Active Member of Clubs or Organizations:   . Attends Banker Meetings:   Marland Kitchen Marital Status:   Intimate Partner Violence:   . Fear of Current or Ex-Partner:   . Emotionally Abused:   Marland Kitchen Physically Abused:   . Sexually Abused:    Family History  Problem Relation Age of Onset  . Hypertension Mother   . Diabetes Mother   . Kidney failure Father   . Diabetes Father   . Hypertension Sister   . Breast cancer Sister   . Asthma  Daughter   . Diabetes Maternal Grandmother   . Alzheimer's disease Paternal Grandmother     OBJECTIVE:  Vitals:   05/07/20 1259 05/07/20 1300 05/07/20 1303  BP: (!) 159/93    Pulse: 64    Resp: 17    Temp: 98.7 F (37.1 C)    TempSrc: Oral    SpO2: 98%    Weight:  184 lb (83.5 kg) 182 lb 15.7 oz (83 kg)  Height:  5\' 3"  (1.6 m) 5\' 3"  (1.6 m)     General appearance: alert; appears fatigued, but nontoxic; speaking in full sentences and tolerating own secretions HEENT: NCAT; Ears: EACs clear, TMs pearly gray; Eyes: PERRL.  EOM grossly intact. Sinuses: nontender; Nose: nares patent without rhinorrhea, Throat: oropharynx clear, tonsils non erythematous or enlarged, uvula midline  Neck: supple without LAD Lungs: unlabored respirations, symmetrical air entry; cough: absent; no respiratory distress; CTAB Heart: regular rate and rhythm.   Skin: warm and dry Psychological: alert and cooperative; normal mood and affect  LABS:  Results for orders placed or performed during the hospital encounter of 05/07/20 (from the past 24 hour(s))  POCT rapid strep A     Status: None   Collection Time: 05/07/20  1:09 PM  Result Value Ref Range   Rapid Strep A Screen Negative Negative     ASSESSMENT & PLAN:  1. Fever, unspecified   2. Flu-like symptoms   3. Suspected COVID-19 virus infection     Meds ordered this encounter  Medications  . cetirizine (ZYRTEC) 10 MG tablet    Sig: Take 1 tablet (10 mg total) by mouth daily.    Dispense:  30 tablet    Refill:  0    Order Specific Question:   Supervising Provider    Answer:   05/09/20 05/09/20  . fluticasone (FLONASE) 50 MCG/ACT nasal spray    Sig: Place 2 sprays into both nostrils daily.    Dispense:  16 g    Refill:  0    Order Specific Question:   Supervising Provider    Answer:   Eustace Moore [[id number]]  . benzonatate (TESSALON) 100 MG capsule    Sig: Take 1 capsule (100 mg total) by mouth every 8 (eight) hours.     Dispense:  21 capsule    Refill:  0    Order Specific Question:   Supervising Provider    Answer:   Eustace Moore [[id number]]  . amoxicillin-clavulanate (AUGMENTIN) 875-125 MG tablet    Sig: Take 1  tablet by mouth every 12 (twelve) hours for 10 days.    Dispense:  20 tablet    Refill:  0    Order Specific Question:   Supervising Provider    Answer:   Eustace Moore [[id number]]    Strep negative.  Culture sent.   COVID testing ordered.  It will take between 5-7 days for test results.  Someone will contact you regarding abnormal results.    In the meantime: You should remain isolated in your home for 10 days from symptom onset AND greater than 72 hours after symptoms resolution (absence of fever without the use of fever-reducing medication and improvement in respiratory symptoms), whichever is longer Get plenty of rest and push fluids Augmentin prescribed for possible sinus infection.   Tessalon Perles prescribed for cough zyrtec for nasal congestion, runny nose, and/or sore throat flonase for nasal congestion and runny nose Use medications daily for symptom relief Use OTC medications like ibuprofen or tylenol as needed fever or pain Call or go to the ED if you have any new or worsening symptoms such as fever, worsening cough, shortness of breath, chest tightness, chest pain, turning blue, changes in mental status, etc...   Reviewed expectations re: course of current medical issues. Questions answered. Outlined signs and symptoms indicating need for more acute intervention. Patient verbalized understanding. After Visit Summary given.         Rennis Harding, PA-C 05/07/20 1340

## 2020-05-07 NOTE — Discharge Instructions (Signed)
Strep negative.  Culture sent.   COVID testing ordered.  It will take between 5-7 days for test results.  Someone will contact you regarding abnormal results.    In the meantime: You should remain isolated in your home for 10 days from symptom onset AND greater than 72 hours after symptoms resolution (absence of fever without the use of fever-reducing medication and improvement in respiratory symptoms), whichever is longer Get plenty of rest and push fluids Augmentin prescribed for possible sinus infection.   Tessalon Perles prescribed for cough zyrtec for nasal congestion, runny nose, and/or sore throat flonase for nasal congestion and runny nose Use medications daily for symptom relief Use OTC medications like ibuprofen or tylenol as needed fever or pain Call or go to the ED if you have any new or worsening symptoms such as fever, worsening cough, shortness of breath, chest tightness, chest pain, turning blue, changes in mental status, etc..Marland Kitchen

## 2020-05-08 ENCOUNTER — Ambulatory Visit (INDEPENDENT_AMBULATORY_CARE_PROVIDER_SITE_OTHER)
Admission: RE | Admit: 2020-05-08 | Discharge: 2020-05-08 | Disposition: A | Discharge status: Home / Self Care | Payer: 59 | Source: Ambulatory Visit

## 2020-05-08 DIAGNOSIS — H9211 Otorrhea, right ear: Secondary | ICD-10-CM

## 2020-05-08 DIAGNOSIS — J019 Acute sinusitis, unspecified: Secondary | ICD-10-CM | POA: Diagnosis not present

## 2020-05-08 DIAGNOSIS — R070 Pain in throat: Secondary | ICD-10-CM

## 2020-05-08 DIAGNOSIS — R5381 Other malaise: Secondary | ICD-10-CM

## 2020-05-08 LAB — SARS-COV-2, NAA 2 DAY TAT

## 2020-05-08 LAB — NOVEL CORONAVIRUS, NAA: SARS-CoV-2, NAA: NOT DETECTED

## 2020-05-08 MED ORDER — NAPROXEN 500 MG PO TABS
500.0000 mg | ORAL_TABLET | Freq: Two times a day (BID) | ORAL | 0 refills | Status: DC
Start: 2020-05-08 — End: 2022-01-07

## 2020-05-08 NOTE — ED Provider Notes (Signed)
Virtual Visit via Video Note:  Stacy Holder  initiated request for Telemedicine visit with [facility] Urgent [facility] team. I connected with Stacy Holder  on 05/08/2020 at 3:38 PM  for a synchronized telemedicine visit using a video enabled HIPPA compliant telemedicine application. I verified that I am speaking with Stacy Holder  using two identifiers. Stacy Eagles, PA-C  was physically located in a [facility] Urgent care site and Stacy Holder was located at a different location.   The limitations of evaluation and management by telemedicine as well as the availability of in-person appointments were discussed. Patient was informed that she  may incur a bill ( including co-pay) for this virtual visit encounter. Stacy Holder  expressed understanding and gave verbal consent to proceed with virtual visit.     History of Present Illness:Stacy Holder  is a 49 y.o. female presents for recheck on her ear pain, drainage, throat pain. Was seen yesterday, at [facility]. Has had negative strep, culture pending, negative rapid COVID testing.  Was started on Augmentin, Zyrtec, Flonase, bezonatate. She is taking medications as prescribed. She is breathing better from her nose but still hurts to swallow, still feels malaise, has right ear drainage and pain and states that she knows she has an ear infection. She is also wondering about her quarantine because she saw a negative COVID test and does not know what that means with respect to her job.    ROS  No current facility-administered medications for this encounter.   Current Outpatient Medications  Medication Sig Dispense Refill  . acetaminophen (TYLENOL) 500 MG tablet Take 500-1,000 mg by mouth every 4 (four) hours as needed for mild pain or headache.     Marland Kitchen amoxicillin-clavulanate (AUGMENTIN) 875-125 MG tablet Take 1 tablet by mouth every 12 (twelve) hours for 10 days. 20 tablet 0  . benzonatate (TESSALON) 100 MG capsule Take 1 capsule (100 mg  total) by mouth every 8 (eight) hours. 21 capsule 0  . bismuth subsalicylate (PEPTO BISMOL) 262 MG/15ML suspension Take 30 mLs by mouth every 6 (six) hours as needed for indigestion or diarrhea or loose stools.    . cetirizine (ZYRTEC) 10 MG tablet Take 1 tablet (10 mg total) by mouth daily. 30 tablet 0  . Cholecalciferol (VITAMIN D3) 50 MCG (2000 UT) TABS Take 1 tablet by mouth every morning.    Marland Kitchen estradiol (ESTRACE) 2 MG tablet Take 1 tablet (2 mg total) by mouth daily. 90 tablet 3  . fluticasone (FLONASE) 50 MCG/ACT nasal spray Place 2 sprays into both nostrils daily. 16 g 0  . ibuprofen (ADVIL,MOTRIN) 800 MG tablet Take 1 tablet (800 mg total) by mouth every 8 (eight) hours as needed for headache. (Patient not taking: Reported on 07/14/2019) 21 tablet 0  . Multiple Vitamins-Minerals (EMERGEN-C IMMUNE PLUS) PACK Take 1 Package by mouth daily.    . silver sulfADIAZINE (SILVADENE) 1 % cream Use to area 2-3 times daily 50 g 11     Allergies  Allergen Reactions  . Latex Other (See Comments)    blisters  . Tape Rash    Cause a burn     Past Medical History:  Diagnosis Date  . BV (bacterial vaginosis) 11/24/2015  . Rectal pressure 11/24/2015  . Rectocele 11/24/2015  . Vaginal discharge 11/24/2015  . Vaginal odor 11/24/2015    Past Surgical History:  Procedure Laterality Date  . ABDOMINAL HYSTERECTOMY    . Hammer Toe Repair Left 05/30/2017  Left #5 Toe  . TUBAL LIGATION        Observations/Objective: Physical Exam Constitutional:      General: She is not in acute distress.    Appearance: Normal appearance. She is well-developed. She is not ill-appearing, toxic-appearing or diaphoretic.  Eyes:     Extraocular Movements: Extraocular movements intact.  Pulmonary:     Effort: Pulmonary effort is normal.  Neurological:     General: No focal deficit present.     Mental Status: She is alert and oriented to person, place, and time.  Psychiatric:        Mood and Affect: Mood  normal.        Behavior: Behavior normal.        Thought Content: Thought content normal.        Judgment: Judgment normal.      Assessment and Plan:  PDMP not reviewed this encounter.  1. Acute sinusitis, recurrence not specified, unspecified location   2. Otorrhea of right ear   3. Throat pain   4. Malaise    Patient was very upset that she did not home her diagnosis from yesterday's visit.  I advised patient that she is not having to quarantine for 10 days due to a negative Covid test.  I reviewed all labs and emphasized the her throat culture for strep was pending still.  I encourage patient to continue taking Augmentin as prescribed for possible sinus infection as documented in yesterday's assessment and plan.  Unfortunately, I am not able to examine patients here through video visit and recommended an in person evaluation especially if her symptoms persist beyond today.  Use naproxen for pain and inflammation in the meantime.  Maintain Zyrtec, hold Flonase. Counseled patient on potential for adverse effects with medications prescribed/recommended today, ER and return-to-clinic precautions discussed, patient verbalized understanding.    Follow Up Instructions:    I discussed the assessment and treatment plan with the patient. The patient was provided an opportunity to ask questions and all were answered. The patient agreed with the plan and demonstrated an understanding of the instructions.   The patient was advised to call back or seek an in-person evaluation if the symptoms worsen or if the condition fails to improve as anticipated.  I provided 15 minutes of non-face-to-face time during this encounter.    Wallis Bamberg, PA-C  05/08/2020 3:38 PM         Wallis Bamberg, PA-C 05/08/20 1553

## 2020-05-11 LAB — CULTURE, GROUP A STREP (THRC)

## 2020-05-19 ENCOUNTER — Other Ambulatory Visit: Payer: Self-pay | Admitting: Obstetrics & Gynecology

## 2020-07-06 ENCOUNTER — Other Ambulatory Visit (HOSPITAL_COMMUNITY)
Admission: RE | Admit: 2020-07-06 | Discharge: 2020-07-06 | Disposition: A | Discharge status: Home / Self Care | Payer: 59 | Source: Ambulatory Visit | Attending: Adult Health | Admitting: Adult Health

## 2020-07-06 ENCOUNTER — Encounter: Payer: Self-pay | Admitting: Adult Health

## 2020-07-06 ENCOUNTER — Ambulatory Visit (INDEPENDENT_AMBULATORY_CARE_PROVIDER_SITE_OTHER): Payer: 59 | Admitting: Adult Health

## 2020-07-06 VITALS — BP 136/88 | HR 54 | Ht 63.25 in | Wt 178.0 lb

## 2020-07-06 DIAGNOSIS — Z113 Encounter for screening for infections with a predominantly sexual mode of transmission: Secondary | ICD-10-CM

## 2020-07-06 DIAGNOSIS — B9689 Other specified bacterial agents as the cause of diseases classified elsewhere: Secondary | ICD-10-CM

## 2020-07-06 DIAGNOSIS — N898 Other specified noninflammatory disorders of vagina: Secondary | ICD-10-CM

## 2020-07-06 DIAGNOSIS — N760 Acute vaginitis: Secondary | ICD-10-CM

## 2020-07-06 DIAGNOSIS — R319 Hematuria, unspecified: Secondary | ICD-10-CM | POA: Diagnosis not present

## 2020-07-06 DIAGNOSIS — R350 Frequency of micturition: Secondary | ICD-10-CM

## 2020-07-06 LAB — POCT URINALYSIS DIPSTICK
Glucose, UA: NEGATIVE
Ketones, UA: NEGATIVE
Leukocytes, UA: NEGATIVE
Nitrite, UA: NEGATIVE
Protein, UA: NEGATIVE

## 2020-07-06 LAB — POCT WET PREP (WET MOUNT)
Clue Cells Wet Prep Whiff POC: POSITIVE
WBC, Wet Prep HPF POC: POSITIVE

## 2020-07-06 MED ORDER — SILVER SULFADIAZINE 1 % EX CREA: 1.0000 "application " | TOPICAL_CREAM | Freq: Every day | CUTANEOUS | 0 refills | Status: AC

## 2020-07-06 MED ORDER — METRONIDAZOLE 0.75 % VA GEL: 1.0000 | Freq: Every day | VAGINAL | 0 refills | Status: DC

## 2020-07-06 NOTE — Progress Notes (Signed)
  Subjective:     Patient ID: Stacy Holder, female   DOB: 1971-02-08, 49 y.o.   MRN: [id number]  HPI Stacy Holder is a 49 year old black female, single sp hysterectomy in complaining of vaginal  discharge with odor and itching and burning after urination. Has areas on abdomen that Dr Despina Hidden told her was staph and prescribed silvadene, she never got, and wants Rx PCP is Dr Sherwood Gambler.   Review of Systems Has vaginal discharge with odor for 3-4 days  Has itching and burning after urination Reviewed past medical,surgical, social and family history. Reviewed medications and allergies.     Objective:   Physical Exam BP (!) 136/88 (BP Location: Left Arm, Patient Position: Sitting, Cuff Size: Normal)   Pulse 54   Ht 5' 3.25" (1.607 m)   Wt 178 lb (80.7 kg)   BMI 31.28 kg/m  urine dipstick is trace blood.  Skin warm and dry.Pelvic: external genitalia is normal in appearance no lesions, vagina: white discharge with odor,urethra has no lesions or masses noted, cervix and uterus are absent,adnexa: no masses or tenderness noted. Bladder is non tender and no masses felt. Wet prep: + for clue cells and +WBCs. CV swab obtained Examination chaperoned by Malachy Mood LPN    Assessment:     1. Hematuria, unspecified type  2. Urinary frequency   3. Vaginal odor CV swab sent  4. Vaginal discharge CV swab sent  5. BV (bacterial vaginosis) Will Rx Metrogel Meds ordered this encounter  Medications  . metroNIDAZOLE (METROGEL VAGINAL) 0.75 % vaginal gel    Sig: Place 1 Applicatorful vaginally at bedtime.    Dispense:  70 g    Refill:  0    Order Specific Question:   Supervising Provider    Answer:   EURE, LUTHER H [[id number]]  . silver sulfADIAZINE (SILVADENE) 1 % cream    Sig: Apply 1 application topically daily.    Dispense:  50 g    Refill:  0    Order Specific Question:   Supervising Provider    Answer:   Despina Hidden, LUTHER H [[id number]]    6. Screening examination for STD (sexually transmitted  disease) Check HIV, RPR and hepatitis C antibody     Plan:     Follow up prn

## 2020-07-07 LAB — CERVICOVAGINAL ANCILLARY ONLY
Bacterial Vaginitis (gardnerella): POSITIVE — AB
Candida Glabrata: NEGATIVE
Candida Vaginitis: NEGATIVE
Chlamydia: NEGATIVE
Comment: NEGATIVE
Comment: NEGATIVE
Comment: NEGATIVE
Comment: NEGATIVE
Comment: NEGATIVE
Comment: NORMAL
Neisseria Gonorrhea: NEGATIVE
Trichomonas: NEGATIVE

## 2020-07-07 LAB — HEPATITIS C ANTIBODY: Hep C Virus Ab: <0.1 s/co ratio (ref 0.0–0.9)

## 2020-07-07 LAB — HIV ANTIBODY (ROUTINE TESTING W REFLEX): HIV Screen 4th Generation wRfx: NONREACTIVE

## 2020-07-07 LAB — RPR: RPR Ser Ql: NONREACTIVE

## 2020-09-14 ENCOUNTER — Other Ambulatory Visit (HOSPITAL_COMMUNITY): Payer: Self-pay | Admitting: Internal Medicine

## 2020-09-14 DIAGNOSIS — Z1231 Encounter for screening mammogram for malignant neoplasm of breast: Secondary | ICD-10-CM

## 2020-09-21 ENCOUNTER — Other Ambulatory Visit: Payer: Self-pay | Admitting: Obstetrics & Gynecology

## 2020-09-27 ENCOUNTER — Ambulatory Visit (HOSPITAL_COMMUNITY): Payer: 59

## 2021-01-22 ENCOUNTER — Other Ambulatory Visit: Payer: Self-pay

## 2021-01-22 ENCOUNTER — Other Ambulatory Visit (HOSPITAL_COMMUNITY)
Admission: RE | Admit: 2021-01-22 | Discharge: 2021-01-22 | Disposition: A | Discharge status: Home / Self Care | Payer: BC Managed Care – PPO | Source: Ambulatory Visit | Attending: Obstetrics & Gynecology | Admitting: Obstetrics & Gynecology

## 2021-01-22 ENCOUNTER — Other Ambulatory Visit (INDEPENDENT_AMBULATORY_CARE_PROVIDER_SITE_OTHER): Payer: BC Managed Care – PPO

## 2021-01-22 DIAGNOSIS — N898 Other specified noninflammatory disorders of vagina: Secondary | ICD-10-CM | POA: Diagnosis present

## 2021-01-22 NOTE — Progress Notes (Addendum)
   NURSE VISIT- VAGINITIS/STD/POC  SUBJECTIVE:  Stacy Holder is a 50 y.o. [id number] GYN patientfemale here for a vaginal swab for vaginitis screening, STD screen.  She reports the following symptoms: discharge described as creamy and malodorous, local irritation, odor and vulvar itching for 1 week. Denies abnormal vaginal bleeding, significant pelvic pain, fever, or UTI symptoms.  OBJECTIVE:  There were no vitals taken for this visit.  Appears well, in no apparent distress  ASSESSMENT: Vaginal swab for vaginitis screening  PLAN: Self-collected vaginal probe for Gonorrhea, Chlamydia, Trichomonas, Bacterial Vaginosis, Yeast sent to lab Treatment: to be determined once results are received Follow-up as needed if symptoms persist/worsen, or new symptoms develop  Briseidy Spark A Kalman Nylen  01/22/2021 10:28 AM   Chart reviewed for nurse visit. Agree with plan of care.  Cheral Marker, [place] 01/22/2021 12:56 PM

## 2021-01-23 ENCOUNTER — Other Ambulatory Visit: Payer: Self-pay | Admitting: Women's Health

## 2021-01-23 LAB — CERVICOVAGINAL ANCILLARY ONLY
Bacterial Vaginitis (gardnerella): POSITIVE — AB
Candida Glabrata: NEGATIVE
Candida Vaginitis: NEGATIVE
Chlamydia: NEGATIVE
Comment: NEGATIVE
Comment: NEGATIVE
Comment: NEGATIVE
Comment: NEGATIVE
Comment: NEGATIVE
Comment: NORMAL
Neisseria Gonorrhea: NEGATIVE
Trichomonas: NEGATIVE

## 2021-01-23 MED ORDER — METRONIDAZOLE 500 MG PO TABS
500.0000 mg | ORAL_TABLET | Freq: Two times a day (BID) | ORAL | 0 refills | Status: DC
Start: 2021-01-23 — End: 2022-01-07

## 2021-02-13 ENCOUNTER — Ambulatory Visit (HOSPITAL_COMMUNITY): Payer: BC Managed Care – PPO | Attending: Neurosurgery

## 2021-02-13 ENCOUNTER — Other Ambulatory Visit: Payer: Self-pay

## 2021-02-13 DIAGNOSIS — M542 Cervicalgia: Secondary | ICD-10-CM | POA: Insufficient documentation

## 2021-02-13 DIAGNOSIS — M5412 Radiculopathy, cervical region: Secondary | ICD-10-CM | POA: Insufficient documentation
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
## 2021-02-19 ENCOUNTER — Other Ambulatory Visit: Payer: Self-pay

## 2021-02-19 ENCOUNTER — Ambulatory Visit (HOSPITAL_COMMUNITY): Payer: BC Managed Care – PPO

## 2021-02-19 ENCOUNTER — Encounter (HOSPITAL_COMMUNITY): Payer: Self-pay

## 2021-02-19 DIAGNOSIS — M542 Cervicalgia: Secondary | ICD-10-CM

## 2021-02-19 DIAGNOSIS — M5412 Radiculopathy, cervical region: Secondary | ICD-10-CM
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
## 2021-02-28 ENCOUNTER — Ambulatory Visit (HOSPITAL_COMMUNITY): Payer: BC Managed Care – PPO

## 2021-03-02 ENCOUNTER — Encounter (HOSPITAL_COMMUNITY): Payer: BC Managed Care – PPO

## 2021-03-07 ENCOUNTER — Ambulatory Visit (HOSPITAL_COMMUNITY): Payer: BC Managed Care – PPO

## 2021-03-07 ENCOUNTER — Other Ambulatory Visit: Payer: Self-pay

## 2021-03-07 ENCOUNTER — Encounter (HOSPITAL_COMMUNITY): Payer: Self-pay

## 2021-03-07 DIAGNOSIS — M542 Cervicalgia: Secondary | ICD-10-CM | POA: Diagnosis not present

## 2021-03-07 DIAGNOSIS — M5412 Radiculopathy, cervical region: Secondary | ICD-10-CM
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
## 2021-03-07 NOTE — Patient Instructions (Signed)
Access Code: [email or web address] URL: [email or web address] Date: 03/07/2021 Prepared by: Shary Decamp  Exercises Shoulder External Rotation and Scapular Retraction with Resistance - 1 x daily - 7 x weekly - 3 sets - 10 reps Standing Shoulder Horizontal Abduction with Resistance - 1 x daily - 7 x weekly - 3 sets - 10 reps Standing Bilateral Low Shoulder Row with Anchored Resistance - 1 x daily - 7 x weekly - 3 sets - 10 reps

## 2021-03-14 ENCOUNTER — Ambulatory Visit (HOSPITAL_COMMUNITY): Payer: BC Managed Care – PPO | Attending: Neurosurgery

## 2021-03-14 ENCOUNTER — Other Ambulatory Visit: Payer: Self-pay

## 2021-03-14 DIAGNOSIS — M542 Cervicalgia: Secondary | ICD-10-CM | POA: Diagnosis present

## 2021-03-14 DIAGNOSIS — M5412 Radiculopathy, cervical region: Secondary | ICD-10-CM | POA: Insufficient documentation
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
## 2021-03-20 ENCOUNTER — Encounter (HOSPITAL_COMMUNITY): Payer: BC Managed Care – PPO

## 2021-04-25 LAB — HM MAMMOGRAPHY

## 2021-05-18 ENCOUNTER — Other Ambulatory Visit: Payer: Self-pay

## 2021-05-18 DIAGNOSIS — I8393 Asymptomatic varicose veins of bilateral lower extremities: Secondary | ICD-10-CM

## 2021-06-12 ENCOUNTER — Encounter: Payer: Self-pay | Admitting: Vascular Surgery

## 2021-06-12 ENCOUNTER — Other Ambulatory Visit: Payer: Self-pay

## 2021-06-12 ENCOUNTER — Ambulatory Visit: Payer: BC Managed Care – PPO | Admitting: Vascular Surgery

## 2021-06-12 ENCOUNTER — Ambulatory Visit (HOSPITAL_COMMUNITY)
Admission: RE | Admit: 2021-06-12 | Discharge: 2021-06-12 | Disposition: A | Discharge status: Home / Self Care | Payer: BC Managed Care – PPO | Source: Ambulatory Visit | Attending: Vascular Surgery | Admitting: Vascular Surgery

## 2021-06-12 DIAGNOSIS — I8393 Asymptomatic varicose veins of bilateral lower extremities: Secondary | ICD-10-CM | POA: Diagnosis not present

## 2021-06-12 DIAGNOSIS — I872 Venous insufficiency (chronic) (peripheral): Secondary | ICD-10-CM | POA: Insufficient documentation

## 2021-06-12 DIAGNOSIS — M7989 Other specified soft tissue disorders: Secondary | ICD-10-CM

## 2021-06-12 NOTE — Progress Notes (Signed)
Patient name: Stacy Holder MRN: [id number] DOB: [birth year] Sex: female  REASON FOR CONSULT: Evaluate varicose veins with pain  HPI: Stacy Holder is a 50 y.o. female, that presents for evaluation of varicose veins with pain.  Specifically she states she has been having a lot of pain in the medial left calf that has been ongoing since her first COVID-vaccine in April 2021.  She states her and her PCP have been trying to figure out the etiology for this.  She was referred to evaluate for varicose veins and venous insufficiency as possible etiology.  She denies any history of DVT.  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  She is not wearing compression.  She does feel she has lower extremity swelling on a daily basis as well.  Past Medical History:  Diagnosis Date   BV (bacterial vaginosis) 11/24/2015   Rectal pressure 11/24/2015   Rectocele 11/24/2015   Vaginal discharge 11/24/2015   Vaginal odor 11/24/2015    Past Surgical History:  Procedure Laterality Date   ABDOMINAL HYSTERECTOMY     Hammer Toe Repair Left 05/30/2017   Left #5 Toe   TUBAL LIGATION      Family History  Problem Relation Age of Onset   Hypertension Mother    Diabetes Mother    Kidney failure Father    Diabetes Father    Hypertension Sister    Breast cancer Sister    Asthma Daughter    Diabetes Maternal Grandmother    Alzheimer's disease Paternal Grandmother     SOCIAL HISTORY: Social History   Socioeconomic History   Marital status: Single    Spouse name: Not on file   Number of children: 2   Years of education: Not on file   Highest education level: Not on file  Occupational History   Not on file  Tobacco Use   Smoking status: Former    Packs/day: 0.25    Years: 15.00    Pack years: 3.75    Types: Cigarettes    Quit date: 10/08/2019    Years since quitting:  1.6   Smokeless tobacco: Never  Vaping Use   Vaping Use: Never used  Substance and Sexual Activity   Alcohol use: Yes    Comment: occ   Drug use: No   Sexual activity: Yes    Birth control/protection: Surgical    Comment: hyst  Other Topics Concern   Not on file  Social History Narrative   Not on file   Social Determinants of Health   Financial Resource Strain: Not on file  Food Insecurity: Not on file  Transportation Needs: Not on file  Physical Activity: Not on file  Stress: Not on file  Social Connections: Not on file  Intimate Partner Violence: Not on file    Allergies  Allergen Reactions   Latex Other (See Comments)    blisters   Tape Rash    Cause a burn    Current Outpatient Medications  Medication Sig Dispense Refill   acetaminophen (TYLENOL) 500 MG tablet Take 500-1,000 mg by mouth every 4 (four) hours as needed for mild pain or headache.      bismuth subsalicylate (PEPTO BISMOL) 262 MG/15ML suspension Take 30 mLs by mouth every 6 (six) hours as needed for indigestion or diarrhea or loose stools.     cetirizine (ZYRTEC) 10  MG tablet Take 1 tablet (10 mg total) by mouth daily. 30 tablet 0   Cholecalciferol (VITAMIN D3) 50 MCG (2000 UT) TABS Take 1 tablet by mouth every morning.     cyclobenzaprine (FLEXERIL) 10 MG tablet Take 10 mg by mouth 3 (three) times daily as needed.     estradiol (ESTRACE) 2 MG tablet Take 1 tablet by mouth once daily 90 tablet 3   fluticasone (FLONASE) 50 MCG/ACT nasal spray Place 2 sprays into both nostrils daily. 16 g 0   gabapentin (NEURONTIN) 100 MG capsule Take 100 mg by mouth 3 (three) times daily.     metroNIDAZOLE (FLAGYL) 500 MG tablet Take 1 tablet (500 mg total) by mouth 2 (two) times daily. 14 tablet 0   Multiple Vitamins-Minerals (EMERGEN-C IMMUNE PLUS) PACK Take 1 Package by mouth daily.     silver sulfADIAZINE (SILVADENE) 1 % cream Apply 1 application topically daily. 50 g 0   metroNIDAZOLE (METROGEL VAGINAL) 0.75 %  vaginal gel Place 1 Applicatorful vaginally at bedtime. (Patient not taking: Reported on 06/12/2021) 70 g 0   naproxen (NAPROSYN) 500 MG tablet Take 1 tablet (500 mg total) by mouth 2 (two) times daily with a meal. (Patient not taking: Reported on 06/12/2021) 30 tablet 0   No current facility-administered medications for this visit.    REVIEW OF SYSTEMS:  [X]  denotes positive finding, [ ]  denotes negative finding Cardiac  Comments:  Chest pain or chest pressure:    Shortness of breath upon exertion:    Short of breath when lying flat:    Irregular heart rhythm:        Vascular    Pain in calf, thigh, or hip brought on by ambulation:    Pain in feet at night that wakes you up from your sleep:     Blood clot in your veins:    Leg swelling:         Pulmonary    Oxygen at home:    Productive cough:     Wheezing:         Neurologic    Sudden weakness in arms or legs:     Sudden numbness in arms or legs:     Sudden onset of difficulty speaking or slurred speech:    Temporary loss of vision in one eye:     Problems with dizziness:         Gastrointestinal    Blood in stool:     Vomited blood:         Genitourinary    Burning when urinating:     Blood in urine:        Psychiatric    Major depression:         Hematologic    Bleeding problems:    Problems with blood clotting too easily:        Skin    Rashes or ulcers:        Constitutional    Fever or chills:      PHYSICAL EXAM: Vitals:   06/12/21 1349  BP: 98/65  Pulse: 63  Resp: 16  Temp: 97.9 F (36.6 C)  TempSrc: Temporal  SpO2: 97%  Weight: 162 lb (73.5 kg)  Height: 5\' 4"  (1.626 m)    GENERAL: The patient is a well-nourished female, in no acute distress. The vital signs are documented above. CARDIAC: There is a regular rate and rhythm.  VASCULAR:  Palpable femoral pulses bilaterally Palpable dorsalis pedis pulses bilaterally PULMONARY: No respiratory  distress. ABDOMEN: Soft and  non-tender. MUSCULOSKELETAL: There are no major deformities or cyanosis. NEUROLOGIC: No focal weakness or paresthesias are detected. SKIN: There are no ulcers or rashes noted. PSYCHIATRIC: The patient has a normal affect.  DATA:   Indications: Pain, and varicosities.  Other Indications: Painful area on anterior medial aspect of mid left  lower leg.   Performing Technologist: Hardie Lora RVT      Examination Guidelines: A complete evaluation includes B-mode imaging,  spectral  Doppler, color Doppler, and power Doppler as needed of all accessible  portions  of each vessel. Bilateral testing is considered an integral part of a  complete  examination. Limited examinations for reoccurring indications may be  performed  as noted. The reflux portion of the exam is performed with the patient in  reverse Trendelenburg.  Significant venous reflux is defined as >500 ms in the superficial venous  system, and >1 second in the deep venous system.      Venous Reflux Times  +--------------+---------+------+-----------+------------+--------+  RIGHT         Reflux NoRefluxReflux TimeDiameter cmsComments                          Yes                                   +--------------+---------+------+-----------+------------+--------+  CFV                     yes   >1 second                       +--------------+---------+------+-----------+------------+--------+  FV prox       no                                              +--------------+---------+------+-----------+------------+--------+  FV mid        no                                              +--------------+---------+------+-----------+------------+--------+  FV dist       no                                              +--------------+---------+------+-----------+------------+--------+  Popliteal     no                                               +--------------+---------+------+-----------+------------+--------+  GSV at [facility]    no                           0.782              +--------------+---------+------+-----------+------------+--------+  GSV prox thighno  0.525              +--------------+---------+------+-----------+------------+--------+  GSV mid thigh no                           0.395              +--------------+---------+------+-----------+------------+--------+  GSV dist thighno                           0.341              +--------------+---------+------+-----------+------------+--------+  GSV at knee   no                           0.394              +--------------+---------+------+-----------+------------+--------+  GSV prox calf                              0.296              +--------------+---------+------+-----------+------------+--------+  GSV mid calf                               0.315              +--------------+---------+------+-----------+------------+--------+  SSV Pop Fossa no                           0.248              +--------------+---------+------+-----------+------------+--------+  SSV prox calf no                           0.242              +--------------+---------+------+-----------+------------+--------+  SSV mid calf                               0.246              +--------------+---------+------+-----------+------------+--------+      +--------------+---------+------+-----------+------------+--------+  LEFT          Reflux NoRefluxReflux TimeDiameter cmsComments                          Yes                                   +--------------+---------+------+-----------+------------+--------+  CFV                     yes   >1 second                       +--------------+---------+------+-----------+------------+--------+  FV prox       no                                               +--------------+---------+------+-----------+------------+--------+  FV mid        no                                              +--------------+---------+------+-----------+------------+--------+  FV dist       no                                              +--------------+---------+------+-----------+------------+--------+  Popliteal     no                                              +--------------+---------+------+-----------+------------+--------+  GSV at SFJ    no                           0.515              +--------------+---------+------+-----------+------------+--------+  GSV prox thighno                           0.393              +--------------+---------+------+-----------+------------+--------+  GSV mid thigh no                           0.334              +--------------+---------+------+-----------+------------+--------+  GSV dist thighno                           0.399              +--------------+---------+------+-----------+------------+--------+  GSV at knee   no                           0.419              +--------------+---------+------+-----------+------------+--------+  GSV prox calf                              0.297              +--------------+---------+------+-----------+------------+--------+  GSV mid calf                               0.324              +--------------+---------+------+-----------+------------+--------+  SSV Pop Fossa no                           0.247              +--------------+---------+------+-----------+------------+--------+  SSV prox calf no                           0.256              +--------------+---------+------+-----------+------------+--------+  SSV mid calf                               0.242              +--------------+---------+------+-----------+------------+--------+           Summary:  Bilateral:  -  No evidence of deep vein  thrombosis seen in the lower extremities,  bilaterally, from the common femoral through the popliteal veins.  - No evidence of superficial venous thrombosis in the lower extremities,  bilaterally.  - No evidence of superficial venous reflux seen in the greater saphenous  veins bilaterally.  - No evidence of superficial venous reflux seen in the short saphenous  veins bilaterally.     Right:  - Venous reflux is noted in the right common femoral vein.     Left:  - Venous reflux is noted in the left common femoral vein.  - Symptomatic area was evaluated. No evidence of SVT, however there is a  large perforation vein at the symptomatic area.      Assessment/Plan:  50 year old female presents for evaluation of lower extremity varicose veins and possible etiology for her left calf pain.  I discussed with her in detail that I do not think her left calf pain is related to venous insufficiency given her saphenous vein in the area appears to be competent and working without any underlying evidence of significant reflux here.  There was no superficial thrombosis or other etiology here either.  There was a perforator vein in this area.  She does have long history of leg swelling and heaviness and does have evidence of deep venous reflux as noted above on her reflux study.  I discussed that we could recommend conservative management given we do not intervene on the deep venous system from a surgical standpoint and would recommend elevation and compression and exercise.  We will get her sized for thigh-high compression stockings today.  Discussed that if she has any worsening symptoms in the future we could always reevaluate her.   Cephus Shelling, MD [facility] Office: [phone]

## 2021-10-06 LAB — HM COLONOSCOPY

## 2021-11-25 ENCOUNTER — Other Ambulatory Visit: Payer: Self-pay | Admitting: Obstetrics & Gynecology

## 2021-11-26 ENCOUNTER — Other Ambulatory Visit: Payer: Self-pay | Admitting: *Deleted

## 2021-11-26 MED ORDER — ESTRADIOL 2 MG PO TABS: 2.0000 mg | ORAL_TABLET | Freq: Every day | ORAL | 3 refills | Status: DC

## 2021-11-26 NOTE — Telephone Encounter (Signed)
Patient states her insurance now requires her to use [facility].  Requesting estradiol tablets be resent to them.

## 2021-12-07 LAB — HM DEXA SCAN: HM Dexa Scan: NORMAL

## 2021-12-09 DIAGNOSIS — N289 Disorder of kidney and ureter, unspecified: Secondary | ICD-10-CM

## 2021-12-09 HISTORY — DX: Disorder of kidney and ureter, unspecified: N28.9

## 2022-01-07 ENCOUNTER — Other Ambulatory Visit (INDEPENDENT_AMBULATORY_CARE_PROVIDER_SITE_OTHER): Payer: BC Managed Care – PPO

## 2022-01-07 ENCOUNTER — Other Ambulatory Visit (HOSPITAL_COMMUNITY)
Admission: RE | Admit: 2022-01-07 | Discharge: 2022-01-07 | Disposition: A | Discharge status: Home / Self Care | Payer: BC Managed Care – PPO | Source: Ambulatory Visit | Attending: Obstetrics & Gynecology | Admitting: Obstetrics & Gynecology

## 2022-01-07 ENCOUNTER — Other Ambulatory Visit: Payer: Self-pay

## 2022-01-07 DIAGNOSIS — N898 Other specified noninflammatory disorders of vagina: Secondary | ICD-10-CM | POA: Diagnosis not present

## 2022-01-07 DIAGNOSIS — R399 Unspecified symptoms and signs involving the genitourinary system: Secondary | ICD-10-CM | POA: Diagnosis not present

## 2022-01-07 LAB — POCT URINALYSIS DIPSTICK OB
Blood, UA: NEGATIVE
Glucose, UA: NEGATIVE
Ketones, UA: NEGATIVE
Nitrite, UA: NEGATIVE
POC,PROTEIN,UA: NEGATIVE

## 2022-01-07 NOTE — Progress Notes (Signed)
° °  NURSE VISIT- UTI SYMPTOMS   SUBJECTIVE:  Stacy Holder is a 51 y.o. [phone] female here for UTI symptoms. She is a GYN patient. She reports flank pain on the right, urinary retention, urinary urgency, and pressure .  OBJECTIVE:  There were no vitals taken for this visit.  Appears well, in no apparent distress  Results for orders placed or performed in visit on 01/07/22 (from the past 24 hour(s))  POC Urinalysis Dipstick OB   Collection Time: 01/07/22  4:22 PM  Result Value Ref Range   Color, UA     Clarity, UA     Glucose, UA Negative Negative   Bilirubin, UA     Ketones, UA neg    Spec Grav, UA     Blood, UA neg    pH, UA     POC,PROTEIN,UA Negative Negative, Trace, Small (1+), Moderate (2+), Large (3+), 4+   Urobilinogen, UA     Nitrite, UA neg    Leukocytes, UA Trace (A) Negative   Appearance     Odor      ASSESSMENT: GYN patient with UTI symptoms and negative nitrites  PLAN: Discussed with [name], AGNP   Rx sent by provider today: No Urine culture sent Call or return to clinic prn if these symptoms worsen or fail to improve as anticipated. Follow-up: as needed     NURSE VISIT- VAGINITIS/STD  SUBJECTIVE:  Stacy Holder is a 51 y.o. [id number] GYN patientfemale here for a vaginal swab for vaginitis screening, STD screen.  She reports the following symptoms: discharge described as malodorous and yellow, local irritation, and odor for 2 weeks. Denies abnormal vaginal bleeding, significant pelvic pain, fever, or UTI symptoms.  OBJECTIVE:  There were no vitals taken for this visit.  Appears well, in no apparent distress  ASSESSMENT: Vaginal swab for  vaginitis & STD screening  PLAN: Self-collected vaginal probe for Gonorrhea, Chlamydia, Trichomonas, Bacterial Vaginosis, Yeast per pt request sent to lab Treatment: to be determined once results are received Follow-up as needed if symptoms persist/worsen, or new symptoms develop  [name]   01/07/2022 4:27 PM

## 2022-01-08 ENCOUNTER — Other Ambulatory Visit: Payer: BC Managed Care – PPO

## 2022-01-08 LAB — URINALYSIS, ROUTINE W REFLEX MICROSCOPIC
Bilirubin, UA: NEGATIVE
Glucose, UA: NEGATIVE
Ketones, UA: NEGATIVE
Nitrite, UA: NEGATIVE
Protein,UA: NEGATIVE
RBC, UA: NEGATIVE
Specific Gravity, UA: 1.021 (ref 1.005–1.030)
Urobilinogen, Ur: 0.2 mg/dL (ref 0.2–1.0)
pH, UA: 5.5 (ref 5.0–7.5)

## 2022-01-08 LAB — MICROSCOPIC EXAMINATION
Casts: NONE SEEN /lpf
WBC, UA: NONE SEEN /hpf (ref 0–5)

## 2022-01-09 ENCOUNTER — Other Ambulatory Visit: Payer: Self-pay | Admitting: Adult Health

## 2022-01-09 LAB — CERVICOVAGINAL ANCILLARY ONLY
Bacterial Vaginitis (gardnerella): POSITIVE — AB
Candida Glabrata: NEGATIVE
Candida Vaginitis: NEGATIVE
Chlamydia: NEGATIVE
Comment: NEGATIVE
Comment: NEGATIVE
Comment: NEGATIVE
Comment: NEGATIVE
Comment: NEGATIVE
Comment: NORMAL
Neisseria Gonorrhea: NEGATIVE
Trichomonas: NEGATIVE

## 2022-01-09 LAB — URINE CULTURE

## 2022-01-09 MED ORDER — METRONIDAZOLE 500 MG PO TABS: 500.0000 mg | ORAL_TABLET | Freq: Two times a day (BID) | ORAL | 0 refills | Status: DC

## 2022-01-09 NOTE — Progress Notes (Signed)
+  BV on vaginal swab, will rx flagyl, no sex or alcohol during treatment  

## 2022-01-30 ENCOUNTER — Other Ambulatory Visit: Payer: Self-pay

## 2022-01-30 ENCOUNTER — Ambulatory Visit: Payer: BC Managed Care – PPO | Admitting: Obstetrics & Gynecology

## 2022-01-30 ENCOUNTER — Other Ambulatory Visit (HOSPITAL_COMMUNITY)
Admission: RE | Admit: 2022-01-30 | Discharge: 2022-01-30 | Disposition: A | Discharge status: Home / Self Care | Payer: BC Managed Care – PPO | Source: Ambulatory Visit | Attending: Obstetrics & Gynecology | Admitting: Obstetrics & Gynecology

## 2022-01-30 ENCOUNTER — Encounter: Payer: Self-pay | Admitting: Obstetrics & Gynecology

## 2022-01-30 VITALS — BP 152/78 | HR 76 | Ht 63.25 in | Wt 151.0 lb

## 2022-01-30 DIAGNOSIS — N939 Abnormal uterine and vaginal bleeding, unspecified: Secondary | ICD-10-CM | POA: Diagnosis not present

## 2022-01-30 DIAGNOSIS — N760 Acute vaginitis: Secondary | ICD-10-CM | POA: Insufficient documentation

## 2022-01-30 DIAGNOSIS — Z90710 Acquired absence of both cervix and uterus: Secondary | ICD-10-CM

## 2022-01-30 MED ORDER — BORIC ACID VAGINAL 600 MG VA SUPP: 600.0000 mg | VAGINAL | 6 refills | Status: DC

## 2022-01-30 NOTE — Progress Notes (Signed)
° °  GYN VISIT Patient name: Stacy Holder MRN [id number]  Date of birth: 05/17/1971 Chief Complaint:   Vaginal bleeding  History of Present Illness:   Stacy Holder is a 51 y.o. [phone] PH female being seen today for the following concerns:  -Vaginal bleeding: Pt notes that immediately following intercourse she had some bright red bleeding that then transitioned to brown spotting.  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 She has been treated with Flagyl vaginal and oral.  Treatment does work, but symptoms soon return.  Thinks she still has an infection currently- notes discharge and odor.    No LMP recorded. Patient has had a hysterectomy.  Depression screen [facility] 2/9 10/23/2018 04/01/2017  Decreased Interest 0 0  Down, Depressed, Hopeless 0 0  PHQ - 2 Score 0 0     Review of Systems:   Pertinent items are noted in HPI Denies fever/chills, dizziness, headaches, visual disturbances, fatigue, shortness of breath, chest pain, abdominal pain, vomiting, bowel movements, urination, or intercourse unless otherwise stated above.  Pertinent History Reviewed:  Reviewed past medical,surgical, social, obstetrical and family history.  Reviewed problem list, medications and allergies. Physical Assessment:   Vitals:   01/30/22 1150  BP: (!) 152/78  Pulse: 76  Weight: 151 lb (68.5 kg)  Height: 5' 3.25" (1.607 m)  Body mass index is 26.54 kg/m.       Physical Examination:   General appearance: alert, well appearing, and in no distress  Psych: mood appropriate, normal affect  Skin: warm & dry   Cardiovascular: normal heart rate noted  Respiratory: normal respiratory effort, no distress  Abdomen: soft, non-tender   Pelvic: VULVA: normal appearing vulva with no masses, tenderness or lesions, VAGINA: thick white odorous discharge- no lesions, masses or abnormalities noted Extremities: no edema   Chaperone:  Esaw Dace, NP student      Assessment & Plan:  1) Vaginal bleeding -etiology unclear- no acute abnormalities noted on exam  2) Recurrent vaginitis -vaginitis panel obtained, possibly bleeding due to this -pt to take existing Metrogel x 5 days then plan for suppression therapy with boric acid weekly -f/u in 2-3 mos  Meds ordered this encounter  Medications   Boric Acid Vaginal 600 MG SUPP    Sig: Place 600 mg vaginally once a week.    Dispense:  4 suppository    Refill:  6     Return in about 3 months (around 04/29/2022) for vaginitis follow up.   Janyth Pupa, DO Attending [place], [facility] for [employer], [place]

## 2022-02-01 LAB — CERVICOVAGINAL ANCILLARY ONLY
Bacterial Vaginitis (gardnerella): POSITIVE — AB
Candida Glabrata: NEGATIVE
Candida Vaginitis: NEGATIVE
Chlamydia: NEGATIVE
Comment: NEGATIVE
Comment: NEGATIVE
Comment: NEGATIVE
Comment: NEGATIVE
Comment: NEGATIVE
Comment: NORMAL
Neisseria Gonorrhea: NEGATIVE
Trichomonas: NEGATIVE

## 2022-02-13 DIAGNOSIS — Z6827 Body mass index (BMI) 27.0-27.9, adult: Secondary | ICD-10-CM | POA: Diagnosis not present

## 2022-02-13 DIAGNOSIS — M62838 Other muscle spasm: Secondary | ICD-10-CM | POA: Diagnosis not present

## 2022-02-13 DIAGNOSIS — R030 Elevated blood-pressure reading, without diagnosis of hypertension: Secondary | ICD-10-CM | POA: Diagnosis not present

## 2022-02-13 DIAGNOSIS — R630 Anorexia: Secondary | ICD-10-CM | POA: Diagnosis not present

## 2022-02-13 DIAGNOSIS — M542 Cervicalgia: Secondary | ICD-10-CM | POA: Diagnosis not present

## 2022-02-13 DIAGNOSIS — G629 Polyneuropathy, unspecified: Secondary | ICD-10-CM | POA: Diagnosis not present

## 2022-02-13 DIAGNOSIS — Z79899 Other long term (current) drug therapy: Secondary | ICD-10-CM | POA: Diagnosis not present

## 2022-02-15 DIAGNOSIS — Z79899 Other long term (current) drug therapy: Secondary | ICD-10-CM | POA: Diagnosis not present

## 2022-03-19 DIAGNOSIS — Z6826 Body mass index (BMI) 26.0-26.9, adult: Secondary | ICD-10-CM | POA: Diagnosis not present

## 2022-03-19 DIAGNOSIS — R030 Elevated blood-pressure reading, without diagnosis of hypertension: Secondary | ICD-10-CM | POA: Diagnosis not present

## 2022-03-19 DIAGNOSIS — G629 Polyneuropathy, unspecified: Secondary | ICD-10-CM | POA: Diagnosis not present

## 2022-03-19 DIAGNOSIS — M62838 Other muscle spasm: Secondary | ICD-10-CM | POA: Diagnosis not present

## 2022-03-19 DIAGNOSIS — Z79899 Other long term (current) drug therapy: Secondary | ICD-10-CM | POA: Diagnosis not present

## 2022-03-19 DIAGNOSIS — R630 Anorexia: Secondary | ICD-10-CM | POA: Diagnosis not present

## 2022-03-19 DIAGNOSIS — M542 Cervicalgia: Secondary | ICD-10-CM | POA: Diagnosis not present

## 2022-03-21 ENCOUNTER — Other Ambulatory Visit (HOSPITAL_COMMUNITY)
Admission: RE | Admit: 2022-03-21 | Discharge: 2022-03-21 | Disposition: A | Discharge status: Home / Self Care | Payer: BC Managed Care – PPO | Source: Ambulatory Visit | Attending: Obstetrics & Gynecology | Admitting: Obstetrics & Gynecology

## 2022-03-21 ENCOUNTER — Ambulatory Visit (INDEPENDENT_AMBULATORY_CARE_PROVIDER_SITE_OTHER): Payer: BC Managed Care – PPO | Admitting: Obstetrics & Gynecology

## 2022-03-21 ENCOUNTER — Encounter: Payer: Self-pay | Admitting: Obstetrics & Gynecology

## 2022-03-21 VITALS — BP 134/78 | HR 84 | Ht 63.5 in | Wt 150.2 lb

## 2022-03-21 DIAGNOSIS — N760 Acute vaginitis: Secondary | ICD-10-CM | POA: Diagnosis not present

## 2022-03-21 MED ORDER — BORIC ACID VAGINAL 600 MG VA SUPP: 600.0000 mg | VAGINAL | 4 refills | Status: DC

## 2022-03-21 NOTE — Addendum Note (Signed)
Addended by: Dorita Sciara, Jishnu Jenniges A on: 03/21/2022 09:49 AM ? ? Modules accepted: Orders ? ?

## 2022-03-21 NOTE — Progress Notes (Signed)
? ?  GYN VISIT ?Patient name: Stacy Holder MRN [id number]  Date of birth: 04-20-1971 ?Chief Complaint:   ?Follow-up ? ?History of Present Illness:   ?Stacy Holder is a 51 y.o. [phone] PH female being seen today for the following: ? ?Recurrent BV: Last visit- 01/30/22- treated with metrogel then weekly boric acid  Today she notes no further bleeding.  Denies discharge or odor. Of note the past few days, she did have some itching- used the boric acid and symptoms did resolve.  She wishes to check today just to be sure.  She otherwise feels like this is working well and overall happy with treatment. ? ?No LMP recorded. Patient has had a hysterectomy. ? ? ?  10/23/2018  ?  9:01 AM 04/01/2017  ? 12:13 PM  ?Depression screen PHQ 2/9  ?Decreased Interest 0 0  ?Down, Depressed, Hopeless 0 0  ?PHQ - 2 Score 0 0  ? ? ? ?Review of Systems:   ?Pertinent items are noted in HPI ?Denies fever/chills, dizziness, headaches, visual disturbances, fatigue, shortness of breath, chest pain, abdominal pain, vomiting, bowel movements, urination, or intercourse unless otherwise stated above.  ?Pertinent History Reviewed:  ?Reviewed past medical,surgical, social, obstetrical and family history.  ?Reviewed problem list, medications and allergies. ?Physical Assessment:  ? ?Vitals:  ? 03/21/22 0857  ?BP: 134/78  ?Pulse: 84  ?Weight: 150 lb 3.2 oz (68.1 kg)  ?Height: 5' 3.5" (1.613 m)  ?Body mass index is 26.19 kg/m?. ? ?     Physical Examination:  ? General appearance: alert, well appearing, and in no distress ? Psych: mood appropriate, normal affect ? Skin: warm & dry  ? Cardiovascular: normal heart rate noted ? Respiratory: normal respiratory effort, no distress ? Abdomen: soft, non-tender  ? Pelvic: VULVA: normal appearing vulva with no masses, tenderness or lesions, VAGINA: normal appearing vagina with normal color and discharge, uterus and cervix surgically absent ? Extremities: no edema  ? ?Chaperone: Jobe Marker   ? ?Assessment  & Plan:  ?1) Recurrent vaginitis ?-doing well with boric acid weekly, plan to continue ?-vaginitis panel obtained today to confirm absence of infection ?-f/u prn or 16yr for annual ? ?Meds ordered this encounter  ?Medications  ? Boric Acid Vaginal 600 MG SUPP  ?  Sig: Place 600 mg vaginally once a week.  ?  Dispense:  12 suppository  ?  Refill:  4  ? ? ? ?Return in about 1 year (around 03/22/2023) for Annual. ? ? ?Myna Hidalgo, DO ?Attending Obstetrician & Gynecologist, Faculty Practice ?Center for [employer], [facility] ? ? ? ?

## 2022-03-22 LAB — CERVICOVAGINAL ANCILLARY ONLY
Bacterial Vaginitis (gardnerella): NEGATIVE
Candida Glabrata: NEGATIVE
Candida Vaginitis: NEGATIVE
Comment: NEGATIVE
Comment: NEGATIVE
Comment: NEGATIVE

## 2022-03-28 DIAGNOSIS — H00011 Hordeolum externum right upper eyelid: Secondary | ICD-10-CM | POA: Diagnosis not present

## 2022-04-16 DIAGNOSIS — M545 Low back pain, unspecified: Secondary | ICD-10-CM | POA: Diagnosis not present

## 2022-04-16 DIAGNOSIS — R630 Anorexia: Secondary | ICD-10-CM | POA: Diagnosis not present

## 2022-04-16 DIAGNOSIS — Z6826 Body mass index (BMI) 26.0-26.9, adult: Secondary | ICD-10-CM | POA: Diagnosis not present

## 2022-04-16 DIAGNOSIS — M62838 Other muscle spasm: Secondary | ICD-10-CM | POA: Diagnosis not present

## 2022-04-16 DIAGNOSIS — G629 Polyneuropathy, unspecified: Secondary | ICD-10-CM | POA: Diagnosis not present

## 2022-04-16 DIAGNOSIS — M546 Pain in thoracic spine: Secondary | ICD-10-CM | POA: Diagnosis not present

## 2022-04-16 DIAGNOSIS — M542 Cervicalgia: Secondary | ICD-10-CM | POA: Diagnosis not present

## 2022-04-16 DIAGNOSIS — Z79899 Other long term (current) drug therapy: Secondary | ICD-10-CM | POA: Diagnosis not present

## 2022-04-16 DIAGNOSIS — R030 Elevated blood-pressure reading, without diagnosis of hypertension: Secondary | ICD-10-CM | POA: Diagnosis not present

## 2022-04-19 DIAGNOSIS — Z79899 Other long term (current) drug therapy: Secondary | ICD-10-CM | POA: Diagnosis not present

## 2022-05-15 DIAGNOSIS — M62838 Other muscle spasm: Secondary | ICD-10-CM | POA: Diagnosis not present

## 2022-05-15 DIAGNOSIS — R630 Anorexia: Secondary | ICD-10-CM | POA: Diagnosis not present

## 2022-05-15 DIAGNOSIS — E663 Overweight: Secondary | ICD-10-CM | POA: Diagnosis not present

## 2022-05-15 DIAGNOSIS — Z131 Encounter for screening for diabetes mellitus: Secondary | ICD-10-CM | POA: Diagnosis not present

## 2022-05-15 DIAGNOSIS — M542 Cervicalgia: Secondary | ICD-10-CM | POA: Diagnosis not present

## 2022-05-15 DIAGNOSIS — Z79899 Other long term (current) drug therapy: Secondary | ICD-10-CM | POA: Diagnosis not present

## 2022-05-19 DIAGNOSIS — Z79899 Other long term (current) drug therapy: Secondary | ICD-10-CM | POA: Diagnosis not present

## 2022-06-14 DIAGNOSIS — R630 Anorexia: Secondary | ICD-10-CM | POA: Diagnosis not present

## 2022-06-14 DIAGNOSIS — E663 Overweight: Secondary | ICD-10-CM | POA: Diagnosis not present

## 2022-06-14 DIAGNOSIS — M62838 Other muscle spasm: Secondary | ICD-10-CM | POA: Diagnosis not present

## 2022-06-14 DIAGNOSIS — Z79899 Other long term (current) drug therapy: Secondary | ICD-10-CM | POA: Diagnosis not present

## 2022-06-14 DIAGNOSIS — M542 Cervicalgia: Secondary | ICD-10-CM | POA: Diagnosis not present

## 2022-06-18 DIAGNOSIS — Z79899 Other long term (current) drug therapy: Secondary | ICD-10-CM | POA: Diagnosis not present

## 2022-07-11 DIAGNOSIS — M25562 Pain in left knee: Secondary | ICD-10-CM | POA: Diagnosis not present

## 2022-07-11 DIAGNOSIS — Z79899 Other long term (current) drug therapy: Secondary | ICD-10-CM | POA: Diagnosis not present

## 2022-07-11 DIAGNOSIS — R630 Anorexia: Secondary | ICD-10-CM | POA: Diagnosis not present

## 2022-07-11 DIAGNOSIS — M542 Cervicalgia: Secondary | ICD-10-CM | POA: Diagnosis not present

## 2022-07-11 DIAGNOSIS — E663 Overweight: Secondary | ICD-10-CM | POA: Diagnosis not present

## 2022-07-14 DIAGNOSIS — Z79899 Other long term (current) drug therapy: Secondary | ICD-10-CM | POA: Diagnosis not present

## 2022-07-15 DIAGNOSIS — Z0000 Encounter for general adult medical examination without abnormal findings: Secondary | ICD-10-CM | POA: Diagnosis not present

## 2022-07-15 DIAGNOSIS — Z7251 High risk heterosexual behavior: Secondary | ICD-10-CM | POA: Diagnosis not present

## 2022-07-15 DIAGNOSIS — R300 Dysuria: Secondary | ICD-10-CM | POA: Diagnosis not present

## 2022-07-15 DIAGNOSIS — Z87891 Personal history of nicotine dependence: Secondary | ICD-10-CM | POA: Diagnosis not present

## 2022-07-15 DIAGNOSIS — R0602 Shortness of breath: Secondary | ICD-10-CM | POA: Diagnosis not present

## 2022-07-15 DIAGNOSIS — Z1159 Encounter for screening for other viral diseases: Secondary | ICD-10-CM | POA: Diagnosis not present

## 2022-07-15 DIAGNOSIS — Z1339 Encounter for screening examination for other mental health and behavioral disorders: Secondary | ICD-10-CM | POA: Diagnosis not present

## 2022-07-15 DIAGNOSIS — Z131 Encounter for screening for diabetes mellitus: Secondary | ICD-10-CM | POA: Diagnosis not present

## 2022-07-15 DIAGNOSIS — E559 Vitamin D deficiency, unspecified: Secondary | ICD-10-CM | POA: Diagnosis not present

## 2022-07-15 DIAGNOSIS — R5383 Other fatigue: Secondary | ICD-10-CM | POA: Diagnosis not present

## 2022-07-29 DIAGNOSIS — Z87891 Personal history of nicotine dependence: Secondary | ICD-10-CM | POA: Diagnosis not present

## 2022-08-08 DIAGNOSIS — Z79899 Other long term (current) drug therapy: Secondary | ICD-10-CM | POA: Diagnosis not present

## 2022-08-08 DIAGNOSIS — M25562 Pain in left knee: Secondary | ICD-10-CM | POA: Diagnosis not present

## 2022-08-08 DIAGNOSIS — M542 Cervicalgia: Secondary | ICD-10-CM | POA: Diagnosis not present

## 2022-08-08 DIAGNOSIS — E663 Overweight: Secondary | ICD-10-CM | POA: Diagnosis not present

## 2022-08-08 DIAGNOSIS — R630 Anorexia: Secondary | ICD-10-CM | POA: Diagnosis not present

## 2022-08-13 DIAGNOSIS — Z79899 Other long term (current) drug therapy: Secondary | ICD-10-CM | POA: Diagnosis not present

## 2022-09-19 DIAGNOSIS — H5712 Ocular pain, left eye: Secondary | ICD-10-CM | POA: Diagnosis not present

## 2022-09-19 DIAGNOSIS — S0502XA Injury of conjunctiva and corneal abrasion without foreign body, left eye, initial encounter: Secondary | ICD-10-CM | POA: Diagnosis not present

## 2022-09-19 DIAGNOSIS — H6123 Impacted cerumen, bilateral: Secondary | ICD-10-CM | POA: Diagnosis not present

## 2022-09-19 DIAGNOSIS — H1132 Conjunctival hemorrhage, left eye: Secondary | ICD-10-CM | POA: Diagnosis not present

## 2022-09-24 DIAGNOSIS — M25562 Pain in left knee: Secondary | ICD-10-CM | POA: Diagnosis not present

## 2022-09-24 DIAGNOSIS — M542 Cervicalgia: Secondary | ICD-10-CM | POA: Diagnosis not present

## 2022-09-24 DIAGNOSIS — Z6826 Body mass index (BMI) 26.0-26.9, adult: Secondary | ICD-10-CM | POA: Diagnosis not present

## 2022-09-24 DIAGNOSIS — M62838 Other muscle spasm: Secondary | ICD-10-CM | POA: Diagnosis not present

## 2022-09-24 DIAGNOSIS — G629 Polyneuropathy, unspecified: Secondary | ICD-10-CM | POA: Diagnosis not present

## 2022-09-24 DIAGNOSIS — Z79899 Other long term (current) drug therapy: Secondary | ICD-10-CM | POA: Diagnosis not present

## 2022-09-24 DIAGNOSIS — R030 Elevated blood-pressure reading, without diagnosis of hypertension: Secondary | ICD-10-CM | POA: Diagnosis not present

## 2022-09-24 DIAGNOSIS — R7309 Other abnormal glucose: Secondary | ICD-10-CM | POA: Diagnosis not present

## 2022-10-23 ENCOUNTER — Other Ambulatory Visit (HOSPITAL_COMMUNITY): Payer: Self-pay

## 2022-10-23 DIAGNOSIS — E663 Overweight: Secondary | ICD-10-CM | POA: Diagnosis not present

## 2022-10-23 DIAGNOSIS — M542 Cervicalgia: Secondary | ICD-10-CM | POA: Diagnosis not present

## 2022-10-23 DIAGNOSIS — G629 Polyneuropathy, unspecified: Secondary | ICD-10-CM | POA: Diagnosis not present

## 2022-10-23 DIAGNOSIS — Z79899 Other long term (current) drug therapy: Secondary | ICD-10-CM | POA: Diagnosis not present

## 2022-10-23 DIAGNOSIS — Z131 Encounter for screening for diabetes mellitus: Secondary | ICD-10-CM | POA: Diagnosis not present

## 2022-10-23 DIAGNOSIS — R630 Anorexia: Secondary | ICD-10-CM | POA: Diagnosis not present

## 2022-10-23 MED ORDER — HYDROCODONE-ACETAMINOPHEN 5-325 MG PO TABS
1.0000 | ORAL_TABLET | Freq: Every day | ORAL | 0 refills | Status: DC | PRN
  Filled 2022-10-23: qty 150, 30d supply, fill #0

## 2022-10-25 DIAGNOSIS — Z79899 Other long term (current) drug therapy: Secondary | ICD-10-CM | POA: Diagnosis not present

## 2022-11-20 ENCOUNTER — Other Ambulatory Visit (HOSPITAL_COMMUNITY): Payer: Self-pay

## 2022-11-20 DIAGNOSIS — R630 Anorexia: Secondary | ICD-10-CM | POA: Diagnosis not present

## 2022-11-20 DIAGNOSIS — M62838 Other muscle spasm: Secondary | ICD-10-CM | POA: Diagnosis not present

## 2022-11-20 DIAGNOSIS — Z79899 Other long term (current) drug therapy: Secondary | ICD-10-CM | POA: Diagnosis not present

## 2022-11-20 DIAGNOSIS — E663 Overweight: Secondary | ICD-10-CM | POA: Diagnosis not present

## 2022-11-20 DIAGNOSIS — G629 Polyneuropathy, unspecified: Secondary | ICD-10-CM | POA: Diagnosis not present

## 2022-11-20 DIAGNOSIS — M542 Cervicalgia: Secondary | ICD-10-CM | POA: Diagnosis not present

## 2022-11-20 MED ORDER — HYDROCODONE-ACETAMINOPHEN 5-325 MG PO TABS
1.0000 | ORAL_TABLET | Freq: Every day | ORAL | 0 refills | Status: DC | PRN
  Filled 2022-11-20: qty 150, 30d supply, fill #0

## 2022-11-22 DIAGNOSIS — Z79899 Other long term (current) drug therapy: Secondary | ICD-10-CM | POA: Diagnosis not present

## 2022-11-28 DIAGNOSIS — Z87891 Personal history of nicotine dependence: Secondary | ICD-10-CM | POA: Diagnosis not present

## 2022-11-28 DIAGNOSIS — J302 Other seasonal allergic rhinitis: Secondary | ICD-10-CM | POA: Diagnosis not present

## 2022-11-28 DIAGNOSIS — H7291 Unspecified perforation of tympanic membrane, right ear: Secondary | ICD-10-CM | POA: Diagnosis not present

## 2022-11-28 DIAGNOSIS — Z6828 Body mass index (BMI) 28.0-28.9, adult: Secondary | ICD-10-CM | POA: Diagnosis not present

## 2022-11-28 DIAGNOSIS — E559 Vitamin D deficiency, unspecified: Secondary | ICD-10-CM | POA: Diagnosis not present

## 2022-11-28 DIAGNOSIS — K219 Gastro-esophageal reflux disease without esophagitis: Secondary | ICD-10-CM | POA: Diagnosis not present

## 2022-11-28 DIAGNOSIS — R300 Dysuria: Secondary | ICD-10-CM | POA: Diagnosis not present

## 2022-12-19 DIAGNOSIS — M62838 Other muscle spasm: Secondary | ICD-10-CM | POA: Diagnosis not present

## 2022-12-19 DIAGNOSIS — R630 Anorexia: Secondary | ICD-10-CM | POA: Diagnosis not present

## 2022-12-19 DIAGNOSIS — Z79899 Other long term (current) drug therapy: Secondary | ICD-10-CM | POA: Diagnosis not present

## 2022-12-19 DIAGNOSIS — G629 Polyneuropathy, unspecified: Secondary | ICD-10-CM | POA: Diagnosis not present

## 2022-12-19 DIAGNOSIS — E663 Overweight: Secondary | ICD-10-CM | POA: Diagnosis not present

## 2022-12-19 DIAGNOSIS — M542 Cervicalgia: Secondary | ICD-10-CM | POA: Diagnosis not present

## 2022-12-23 DIAGNOSIS — Z79899 Other long term (current) drug therapy: Secondary | ICD-10-CM | POA: Diagnosis not present

## 2022-12-25 DIAGNOSIS — R0981 Nasal congestion: Secondary | ICD-10-CM | POA: Diagnosis not present

## 2022-12-25 DIAGNOSIS — H6123 Impacted cerumen, bilateral: Secondary | ICD-10-CM | POA: Diagnosis not present

## 2023-01-03 ENCOUNTER — Encounter: Payer: Self-pay | Admitting: Emergency Medicine

## 2023-01-03 ENCOUNTER — Ambulatory Visit
Admission: EM | Admit: 2023-01-03 | Discharge: 2023-01-03 | Disposition: A | Discharge status: Home / Self Care | Payer: BC Managed Care – PPO | Attending: Emergency Medicine | Admitting: Emergency Medicine

## 2023-01-03 DIAGNOSIS — J111 Influenza due to unidentified influenza virus with other respiratory manifestations: Secondary | ICD-10-CM | POA: Insufficient documentation

## 2023-01-03 DIAGNOSIS — Z1152 Encounter for screening for COVID-19: Secondary | ICD-10-CM | POA: Diagnosis not present

## 2023-01-03 DIAGNOSIS — R6889 Other general symptoms and signs: Secondary | ICD-10-CM | POA: Diagnosis not present

## 2023-01-03 DIAGNOSIS — R509 Fever, unspecified: Secondary | ICD-10-CM | POA: Diagnosis not present

## 2023-01-03 MED ORDER — PROMETHAZINE-DM 6.25-15 MG/5ML PO SYRP: 5.0000 mL | ORAL_SOLUTION | Freq: Four times a day (QID) | ORAL | 0 refills | Status: DC | PRN

## 2023-01-03 MED ORDER — LOPERAMIDE HCL 2 MG PO CAPS: 2.0000 mg | ORAL_CAPSULE | Freq: Four times a day (QID) | ORAL | 0 refills | Status: DC | PRN

## 2023-01-03 MED ORDER — OSELTAMIVIR PHOSPHATE 75 MG PO CAPS: 75.0000 mg | ORAL_CAPSULE | Freq: Two times a day (BID) | ORAL | 0 refills | Status: DC

## 2023-01-03 NOTE — ED Provider Notes (Signed)
[facility]    CSN: [id number] Arrival date & time: 01/03/23  1307      History   Chief Complaint No chief complaint on file.   HPI Stacy Holder is a 52 y.o. female.   Patient presents for evaluation of fever, chills, body aches, nasal congestion, rhinorrhea, bilateral ear pain, sore throat, cough headaches beginning 1 day ago.  Vomiting and diarrhea beginning this morning approximately at 3 to 4 AM.  Decreased appetite has been unable to tolerate food or liquids today.  Increased fatigue and malaise.  Fever peaking at 102.  Cough is productive but denies shortness of breath or wheezing.  Known exposure to influenza at work.  Has attempted use of Mucinex DM, Hall's cough drops which has been somewhat helpful.  Denies respiratory history.  Former smoker.  Past Medical History:  Diagnosis Date   BV (bacterial vaginosis) 11/24/2015   Kidney disease 12/2021   Rectal pressure 11/24/2015   Rectocele 11/24/2015   Vaginal discharge 11/24/2015   Vaginal odor 11/24/2015    Patient Active Problem List   Diagnosis Date Noted   Chronic venous insufficiency 06/12/2021   Vaginal discharge 07/06/2020   Vaginal odor 07/06/2020   Urinary frequency 07/06/2020   Hematuria 07/06/2020   Screening examination for STD (sexually transmitted disease) 07/06/2020   BV (bacterial vaginosis) 11/24/2015   Rectal pressure 11/24/2015   Rectocele 11/24/2015    Past Surgical History:  Procedure Laterality Date   ABDOMINAL HYSTERECTOMY     Hammer Toe Repair Left 05/30/2017   Left #5 Toe   TUBAL LIGATION      OB History     Gravida  3   Para  2   Term  1   Preterm  1   AB  1   Living  2      SAB      IAB  1   Ectopic      Multiple      Live Births  2            Home Medications    Prior to Admission medications   Medication Sig Start Date End Date Taking? Authorizing Provider  bismuth subsalicylate (PEPTO BISMOL) 262 MG/15ML suspension Take 30 mLs by  mouth every 6 (six) hours as needed for indigestion or diarrhea or loose stools.    [provider]  Boric Acid Vaginal 600 MG SUPP Place 600 mg vaginally once a week. 03/21/22   Janyth Pupa, DO  cetirizine (ZYRTEC) 10 MG tablet Take 1 tablet (10 mg total) by mouth daily. 05/07/20   Wurst, [place], PA-C  Cholecalciferol (VITAMIN D3) 50 MCG (2000 UT) TABS Take 1 tablet by mouth every morning.    [provider]  cyclobenzaprine (FLEXERIL) 10 MG tablet Take 10 mg by mouth 3 (three) times daily as needed. 01/08/21   [provider]  dronabinol (MARINOL) 2.5 MG capsule Take 2.5 mg by mouth 3 (three) times daily as needed. 01/04/22   [provider]  estradiol (ESTRACE) 2 MG tablet Take 1 tablet (2 mg total) by mouth daily. 11/26/21   Florian Buff, MD  fluticasone (FLONASE) 50 MCG/ACT nasal spray Place 2 sprays into both nostrils daily. 05/07/20   Wurst, [place], PA-C  gabapentin (NEURONTIN) 100 MG capsule Take 100 mg by mouth 3 (three) times daily. 01/08/21   [provider]  HYDROcodone-acetaminophen (NORCO/VICODIN) 5-325 MG tablet Take 1 tablet by mouth every 6 (six) hours as needed. 01/07/22   [provider]  HYDROcodone-acetaminophen (NORCO/VICODIN) 5-325 MG tablet Take 1 tablet by mouth 5 (five) times daily as needed for pain 11/20/22     metroNIDAZOLE (FLAGYL) 500 MG tablet Take 1 tablet (500 mg total) by mouth 2 (two) times daily. 01/09/22   Adline Potter, NP  Multiple Vitamins-Minerals (EMERGEN-C IMMUNE PLUS) PACK Take 1 Package by mouth daily.    [provider]  naloxone [facility]) nasal spray 4 mg/0.1 mL SMARTSIG:Both Nares Patient not taking: Reported on 03/21/2022 03/19/22   [provider]  silver sulfADIAZINE (SILVADENE) 1 % cream Apply 1 application topically daily. 07/06/20   Adline Potter, NP    Family History Family History  Problem Relation Age of Onset   Hypertension Mother    Diabetes Mother     Kidney failure Father    Diabetes Father    Hypertension Sister    Breast cancer Sister    Asthma Daughter    Diabetes Maternal Grandmother    Alzheimer's disease Paternal Grandmother     Social History Social History   Tobacco Use   Smoking status: Former    Packs/day: 0.25    Years: 15.00    Total pack years: 3.75    Types: Cigarettes    Quit date: 10/08/2019    Years since quitting: 3.2   Smokeless tobacco: Never  Vaping Use   Vaping Use: Never used  Substance Use Topics   Alcohol use: Yes    Comment: occ   Drug use: No     Allergies   Latex and Tape   Review of Systems Review of Systems  Constitutional:  Positive for chills and fever. Negative for activity change, appetite change, diaphoresis, fatigue and unexpected weight change.  HENT:  Positive for congestion, ear pain, rhinorrhea and sore throat. Negative for dental problem, drooling, ear discharge, facial swelling, hearing loss, mouth sores, nosebleeds, postnasal drip, sinus pressure, sinus pain, sneezing, tinnitus, trouble swallowing and voice change.   Respiratory:  Positive for cough. Negative for apnea, choking, chest tightness, shortness of breath, wheezing and stridor.   Cardiovascular: Negative.   Gastrointestinal:  Positive for diarrhea and vomiting. Negative for abdominal distention, abdominal pain, anal bleeding, blood in stool, constipation, nausea and rectal pain.  Musculoskeletal:  Positive for myalgias.  Skin: Negative.   Neurological:  Positive for headaches. Negative for dizziness, tremors, seizures, syncope, facial asymmetry, speech difficulty, weakness, light-headedness and numbness.     Physical Exam Triage Vital Signs ED Triage Vitals  Enc Vitals Group     BP 01/03/23 1315 130/87     Pulse Rate 01/03/23 1315 97     Resp 01/03/23 1315 18     Temp 01/03/23 1315 99.9 F (37.7 C)     Temp Source 01/03/23 1315 Oral     SpO2 01/03/23 1315 94 %     Weight --      Height --      Head  Circumference --      Peak Flow --      Pain Score 01/03/23 1316 10     Pain Loc --      Pain Edu? --      Excl. in GC? --    No data found.  Updated Vital Signs BP 130/87 (BP Location: Right Arm)   Pulse 97   Temp 99.9 F (37.7 C) (Oral)   Resp 18   SpO2 94%   Visual Acuity Right Eye Distance:   Left Eye Distance:   Bilateral Distance:  Right Eye Near:   Left Eye Near:    Bilateral Near:     Physical Exam Constitutional:      Appearance: Normal appearance.  HENT:     Right Ear: Tympanic membrane, ear canal and external ear normal.     Left Ear: Tympanic membrane, ear canal and external ear normal.     Nose: Congestion and rhinorrhea present.     Mouth/Throat:     Mouth: Mucous membranes are moist.     Pharynx: Posterior oropharyngeal erythema present.  Cardiovascular:     Rate and Rhythm: Normal rate and regular rhythm.     Pulses: Normal pulses.     Heart sounds: Normal heart sounds.  Pulmonary:     Effort: Pulmonary effort is normal.     Breath sounds: Normal breath sounds.  Skin:    General: Skin is warm and dry.  Neurological:     Mental Status: She is alert and oriented to person, place, and time. Mental status is at baseline.      UC Treatments / Results  Labs (all labs ordered are listed, but only abnormal results are displayed) Labs Reviewed - No data to display  EKG   Radiology No results found.  Procedures Procedures (including critical care time)  Medications Ordered in UC Medications - No data to display  Initial Impression / Assessment and Plan / UC Course  I have reviewed the triage vital signs and the nursing notes.  Pertinent labs & imaging results that were available during my care of the patient were reviewed by me and considered in my medical decision making (see chart for details).  Flulike symptoms  Low-grade fever of 99.9 noted in triage, patient is ill-appearing but in no signs of distress nor toxic, treating  prophylactically for influenza due to known exposure, COVID test pending, does not qualify for antivirals if positive, prescribe Zofran and Imodium for additional supportive care, may continue use of over-the-counter medications as needed, advised increase fluid intake until symptoms have resolved, may follow-up with his urgent care if symptoms persist or worsen, work note given Final Clinical Impressions(s) / UC Diagnoses   Final diagnoses:  Flu-like symptoms     Discharge Instructions      Symptoms are possibly caused by influenza A, your symptoms are consistent with the current presentation, influenza is a virus and will steadily improve with time unfortunately we do not have testing available at this time  You have been tested for COVID, test pending for up to 24 hours, you will be notified of positive test results only, if positive for COVID you may stop use of Tamiflu, COVID is a virus and will resolve with time  You may take Tamiflu twice daily for the next 5 days, if you are able to get this medication from the pharmacy then continue supportive treatment, symptoms will improve as the virus works as well after system whether or not you take this medication  Zofran every 8 hours to help calm your nausea and vomiting  You may use Imodium every 6 hours to slow your diarrhea    You can take Tylenol and/or Ibuprofen as needed for fever reduction and pain relief.   For cough: honey 1/2 to 1 teaspoon (you can dilute the honey in water or another fluid).  You can also use guaifenesin and dextromethorphan for cough. You can use a humidifier for chest congestion and cough.  If you don't have a humidifier, you can sit in the bathroom with  the hot shower running.      For sore throat: try warm salt water gargles, cepacol lozenges, throat spray, warm tea or water with lemon/honey, popsicles or ice, or OTC cold relief medicine for throat discomfort.   For congestion: take a daily anti-histamine  like Zyrtec, Claritin, and a oral decongestant, such as pseudoephedrine.  You can also use Flonase 1-2 sprays in each nostril daily.   It is important to stay hydrated: drink plenty of fluids (water, gatorade/powerade/pedialyte, juices, or teas) to keep your throat moisturized and help further relieve irritation/discomfort.      ED Prescriptions   None    PDMP not reviewed this encounter.   Hans Eden, NP 01/03/23 1357

## 2023-01-03 NOTE — ED Triage Notes (Signed)
Fever, cough, chills, headache, since yesterday.  Vomiting and diarrhea started today.  Has been taking mucinex DM and currently on prednisone for sinuses.

## 2023-01-03 NOTE — Discharge Instructions (Addendum)
Symptoms are possibly caused by influenza A, your symptoms are consistent with the current presentation, influenza is a virus and will steadily improve with time unfortunately we do not have testing available at this time  You have been tested for COVID, test pending for up to 24 hours, you will be notified of positive test results only, if positive for COVID you may stop use of Tamiflu, COVID is a virus and will resolve with time  You may take Tamiflu twice daily for the next 5 days, if you are able to get this medication from the pharmacy then continue supportive treatment, symptoms will improve as the virus works as well after system whether or not you take this medication  Zofran every 8 hours to help calm your nausea and vomiting  You may use Imodium every 6 hours to slow your diarrhea    You can take Tylenol and/or Ibuprofen as needed for fever reduction and pain relief.   For cough: honey 1/2 to 1 teaspoon (you can dilute the honey in water or another fluid).  You can also use guaifenesin and dextromethorphan for cough. You can use a humidifier for chest congestion and cough.  If you don't have a humidifier, you can sit in the bathroom with the hot shower running.      For sore throat: try warm salt water gargles, cepacol lozenges, throat spray, warm tea or water with lemon/honey, popsicles or ice, or OTC cold relief medicine for throat discomfort.   For congestion: take a daily anti-histamine like Zyrtec, Claritin, and a oral decongestant, such as pseudoephedrine.  You can also use Flonase 1-2 sprays in each nostril daily.   It is important to stay hydrated: drink plenty of fluids (water, gatorade/powerade/pedialyte, juices, or teas) to keep your throat moisturized and help further relieve irritation/discomfort.

## 2023-01-04 LAB — SARS CORONAVIRUS 2 (TAT 6-24 HRS): SARS Coronavirus 2: NEGATIVE

## 2023-01-06 ENCOUNTER — Ambulatory Visit
Admission: EM | Admit: 2023-01-06 | Discharge: 2023-01-06 | Disposition: A | Discharge status: Home / Self Care | Payer: BC Managed Care – PPO | Attending: Nurse Practitioner | Admitting: Nurse Practitioner

## 2023-01-06 DIAGNOSIS — J069 Acute upper respiratory infection, unspecified: Secondary | ICD-10-CM | POA: Diagnosis not present

## 2023-01-06 MED ORDER — BENZONATATE 100 MG PO CAPS: 100.0000 mg | ORAL_CAPSULE | Freq: Three times a day (TID) | ORAL | 0 refills | Status: DC | PRN

## 2023-01-06 NOTE — ED Triage Notes (Signed)
Patient came in Friday with flu like symptoms, she is still having fatigue, headache, cough, with some diarrhea. Patient is taking all the prescribed medications from Friday.

## 2023-01-06 NOTE — ED Provider Notes (Signed)
[facility]    CSN: [id number] Arrival date & time: 01/06/23  1347      History   Chief Complaint Chief Complaint  Patient presents with   Weakness   Headache    HPI Stacy Holder is a 52 y.o. female.   Patient presents today for 5-day history of fevers, body aches and chills, cold sweats, productive cough, wheezing, chest pain after coughing, chest and nasal congestion, runny nose, sore throat, headache, right ear pain with muffled hearing, no drainage from the ear, diarrhea, and fatigue.  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  Patient denies shortness of breath or chest pain at rest.  No abdominal pain, nausea or vomiting.  Appetite is slowly coming back per her report.  Has been taking over-the-counter cough syrup, Zicam, Hall's cough drops, honey/lemon water, and Tamiflu for symptoms with improvement.  Patient denies history of asthma or COPD.  She does report a history of bronchitis.  Reports she quit smoking 3 years ago.    Past Medical History:  Diagnosis Date   BV (bacterial vaginosis) 11/24/2015   Kidney disease 12/2021   Rectal pressure 11/24/2015   Rectocele 11/24/2015   Vaginal discharge 11/24/2015   Vaginal odor 11/24/2015    Patient Active Problem List   Diagnosis Date Noted   Chronic venous insufficiency 06/12/2021   Vaginal discharge 07/06/2020   Vaginal odor 07/06/2020   Urinary frequency 07/06/2020   Hematuria 07/06/2020   Screening examination for STD (sexually transmitted disease) 07/06/2020   BV (bacterial vaginosis) 11/24/2015   Rectal pressure 11/24/2015   Rectocele 11/24/2015    Past Surgical History:  Procedure Laterality Date   ABDOMINAL HYSTERECTOMY     Hammer Toe Repair Left 05/30/2017   Left #5 Toe   TUBAL LIGATION      OB History     Gravida  3   Para  2   Term  1   Preterm  1   AB   1   Living  2      SAB      IAB  1   Ectopic      Multiple      Live Births  2            Home Medications    Prior to Admission medications   Medication Sig Start Date End Date Taking? Authorizing Provider  benzonatate (TESSALON) 100 MG capsule Take 1 capsule (100 mg total) by mouth 3 (three) times daily as needed for cough. Do not take with alcohol or while driving or operating heavy machinery. May cause drowsiness. 01/06/23  Yes Eulogio Bear, NP  bismuth subsalicylate (PEPTO BISMOL) 262 MG/15ML suspension Take 30 mLs by mouth every 6 (six) hours as needed for indigestion or diarrhea or loose stools.   Yes [provider]  Boric Acid Vaginal 600 MG SUPP Place 600 mg vaginally once a week. 03/21/22  Yes Janyth Pupa, DO  cetirizine (ZYRTEC) 10 MG tablet Take 1 tablet (10 mg total) by mouth daily. 05/07/20  Yes Wurst, [place], PA-C  Cholecalciferol (VITAMIN D3) 50 MCG (2000 UT) TABS Take 1 tablet by mouth every morning.   Yes [provider]  cyclobenzaprine (FLEXERIL) 10 MG tablet Take 10 mg by mouth 3 (three) times daily as needed. 01/08/21  Yes [provider]  dronabinol (MARINOL) 2.5 MG capsule Take 2.5 mg by  mouth 3 (three) times daily as needed. 01/04/22  Yes [provider]  estradiol (ESTRACE) 2 MG tablet Take 1 tablet (2 mg total) by mouth daily. 11/26/21  Yes Florian Buff, MD  fluticasone (FLONASE) 50 MCG/ACT nasal spray Place 2 sprays into both nostrils daily. 05/07/20  Yes Wurst, [place], PA-C  gabapentin (NEURONTIN) 100 MG capsule Take 100 mg by mouth 3 (three) times daily. 01/08/21  Yes [provider]  HYDROcodone-acetaminophen (NORCO/VICODIN) 5-325 MG tablet Take 1 tablet by mouth every 6 (six) hours as needed. 01/07/22  Yes [provider]  HYDROcodone-acetaminophen (NORCO/VICODIN) 5-325 MG tablet Take 1 tablet by mouth 5 (five) times daily as needed for pain 11/20/22  Yes   Multiple Vitamins-Minerals  (EMERGEN-C IMMUNE PLUS) PACK Take 1 Package by mouth daily.   Yes [provider]  oseltamivir (TAMIFLU) 75 MG capsule Take 1 capsule (75 mg total) by mouth every 12 (twelve) hours. 01/03/23  Yes White, Adrienne R, NP  promethazine-dextromethorphan (PROMETHAZINE-DM) 6.25-15 MG/5ML syrup Take 5 mLs by mouth 4 (four) times daily as needed for cough. 01/03/23  Yes White, Leitha Schuller, NP  loperamide (IMODIUM) 2 MG capsule Take 1 capsule (2 mg total) by mouth 4 (four) times daily as needed for diarrhea or loose stools. 01/03/23   Hans Eden, NP  naloxone [facility]) nasal spray 4 mg/0.1 mL SMARTSIG:Both Nares Patient not taking: Reported on 03/21/2022 03/19/22   [provider]  silver sulfADIAZINE (SILVADENE) 1 % cream Apply 1 application topically daily. 07/06/20   Estill Dooms, NP    Family History Family History  Problem Relation Age of Onset   Hypertension Mother    Diabetes Mother    Kidney failure Father    Diabetes Father    Hypertension Sister    Breast cancer Sister    Asthma Daughter    Diabetes Maternal Grandmother    Alzheimer's disease Paternal Grandmother     Social History Social History   Tobacco Use   Smoking status: Former    Packs/day: 0.25    Years: 15.00    Total pack years: 3.75    Types: Cigarettes    Quit date: 10/08/2019    Years since quitting: 3.2   Smokeless tobacco: Never  Vaping Use   Vaping Use: Never used  Substance Use Topics   Alcohol use: Yes    Comment: occ   Drug use: No     Allergies   Latex and Tape   Review of Systems Review of Systems Per HPI  Physical Exam Triage Vital Signs ED Triage Vitals  Enc Vitals Group     BP 01/06/23 1527 (!) 154/99     Pulse Rate 01/06/23 1527 67     Resp 01/06/23 1527 16     Temp 01/06/23 1527 98 F (36.7 C)     Temp Source 01/06/23 1527 Oral     SpO2 01/06/23 1527 98 %     Weight --      Height --      Head Circumference --      Peak Flow --      Pain Score  01/06/23 1533 0     Pain Loc --      Pain Edu? --      Excl. in [place]? --    No data found.  Updated Vital Signs BP (!) 154/99 (BP Location: Right Arm)   Pulse 67   Temp 98 F (36.7 C) (Oral)   Resp 16  SpO2 98%   Visual Acuity Right Eye Distance:   Left Eye Distance:   Bilateral Distance:    Right Eye Near:   Left Eye Near:    Bilateral Near:     Physical Exam Vitals and nursing note reviewed.  Constitutional:      General: She is not in acute distress.    Appearance: Normal appearance. She is not ill-appearing or toxic-appearing.  HENT:     Head: Normocephalic and atraumatic.     Right Ear: Ear canal and external ear normal. A middle ear effusion is present.     Left Ear: Ear canal and external ear normal. A middle ear effusion is present.     Nose: Congestion and rhinorrhea present.     Mouth/Throat:     Mouth: Mucous membranes are moist.     Pharynx: Oropharynx is clear. Posterior oropharyngeal erythema present. No oropharyngeal exudate.  Eyes:     General: No scleral icterus.    Extraocular Movements: Extraocular movements intact.  Cardiovascular:     Rate and Rhythm: Normal rate and regular rhythm.  Pulmonary:     Effort: Pulmonary effort is normal. No respiratory distress.     Breath sounds: Normal breath sounds. No wheezing, rhonchi or rales.  Abdominal:     General: Abdomen is flat. Bowel sounds are normal. There is no distension.     Palpations: Abdomen is soft.  Musculoskeletal:     Cervical back: Normal range of motion and neck supple.  Lymphadenopathy:     Cervical: No cervical adenopathy.  Skin:    General: Skin is warm and dry.     Capillary Refill: Capillary refill takes less than 2 seconds.     Coloration: Skin is not jaundiced or pale.     Findings: No erythema or rash.  Neurological:     Mental Status: She is alert and oriented to person, place, and time.     Motor: No weakness.  Psychiatric:        Behavior: Behavior is cooperative.       UC Treatments / Results  Labs (all labs ordered are listed, but only abnormal results are displayed) Labs Reviewed - No data to display  EKG   Radiology No results found.  Procedures Procedures (including critical care time)  Medications Ordered in UC Medications - No data to display  Initial Impression / Assessment and Plan / UC Course  I have reviewed the triage vital signs and the nursing notes.  Pertinent labs & imaging results that were available during my care of the patient were reviewed by me and considered in my medical decision making (see chart for details).   Patient is well-appearing, afebrile, not tachycardic, not tachypneic, oxygenating well on room air.  She is mildly hypertensive today, likely secondary to over-the-counter medication use and acute illness.  1. Viral URI with cough Suspect ongoing viral etiology Examination and vital signs today are reassuring Reassurance provided to the patient Start cough Perles as needed for dry cough Other supportive measures discussed Note given for work ER and return precautions discussed with patient  The patient was given the opportunity to ask questions.  All questions answered to their satisfaction.  The patient is in agreement to this plan.    Final Clinical Impressions(s) / UC Diagnoses   Final diagnoses:  Viral URI with cough     Discharge Instructions      You have a viral upper respiratory infection.  Symptoms should improve over the next  week to 10 days.  If you develop chest pain or shortness of breath, go to the emergency room.  Some things that can make you feel better are: - Increased rest - Increasing fluid with water/sugar free electrolytes - Acetaminophen and ibuprofen as needed for fever/pain - Salt water gargling, chloraseptic spray and throat lozenges for sore throat - OTC guaifenesin (Mucinex) 600 mg twice daily for congestion - Saline sinus flushes or a neti pot for  congestion - Humidifying the air -Tessalon Perles every 8 hours as needed for dry cough      ED Prescriptions     Medication Sig Dispense Auth. Provider   benzonatate (TESSALON) 100 MG capsule Take 1 capsule (100 mg total) by mouth 3 (three) times daily as needed for cough. Do not take with alcohol or while driving or operating heavy machinery. May cause drowsiness. 21 capsule Valentino Nose, NP      PDMP not reviewed this encounter.   Valentino Nose, NP 01/06/23 [id number]

## 2023-01-06 NOTE — Discharge Instructions (Signed)
You have a viral upper respiratory infection.  Symptoms should improve over the next week to 10 days.  If you develop chest pain or shortness of breath, go to the emergency room.  Some things that can make you feel better are: - Increased rest - Increasing fluid with water/sugar free electrolytes - Acetaminophen and ibuprofen as needed for fever/pain - Salt water gargling, chloraseptic spray and throat lozenges for sore throat - OTC guaifenesin (Mucinex) 600 mg twice daily for congestion - Saline sinus flushes or a neti pot for congestion - Humidifying the air -Tessalon Perles every 8 hours as needed for dry cough

## 2023-01-17 DIAGNOSIS — Z79899 Other long term (current) drug therapy: Secondary | ICD-10-CM | POA: Diagnosis not present

## 2023-01-17 DIAGNOSIS — G629 Polyneuropathy, unspecified: Secondary | ICD-10-CM | POA: Diagnosis not present

## 2023-01-17 DIAGNOSIS — M542 Cervicalgia: Secondary | ICD-10-CM | POA: Diagnosis not present

## 2023-01-17 DIAGNOSIS — E663 Overweight: Secondary | ICD-10-CM | POA: Diagnosis not present

## 2023-01-17 DIAGNOSIS — R630 Anorexia: Secondary | ICD-10-CM | POA: Diagnosis not present

## 2023-01-17 DIAGNOSIS — M62838 Other muscle spasm: Secondary | ICD-10-CM | POA: Diagnosis not present

## 2023-01-20 ENCOUNTER — Ambulatory Visit: Payer: BC Managed Care – PPO | Admitting: Obstetrics & Gynecology

## 2023-01-20 ENCOUNTER — Encounter: Payer: Self-pay | Admitting: Obstetrics & Gynecology

## 2023-01-20 VITALS — BP 125/81 | HR 82 | Ht 63.0 in | Wt 152.0 lb

## 2023-01-20 DIAGNOSIS — N941 Unspecified dyspareunia: Secondary | ICD-10-CM | POA: Diagnosis not present

## 2023-01-20 DIAGNOSIS — Z90710 Acquired absence of both cervix and uterus: Secondary | ICD-10-CM | POA: Diagnosis not present

## 2023-01-20 DIAGNOSIS — N952 Postmenopausal atrophic vaginitis: Secondary | ICD-10-CM | POA: Diagnosis not present

## 2023-01-20 DIAGNOSIS — Z79890 Hormone replacement therapy (postmenopausal): Secondary | ICD-10-CM | POA: Diagnosis not present

## 2023-01-20 MED ORDER — ESTRADIOL 0.1 MG/GM VA CREA: TOPICAL_CREAM | VAGINAL | 6 refills | Status: AC

## 2023-01-20 MED ORDER — ESTRADIOL 1 MG PO TABS: 1.0000 mg | ORAL_TABLET | Freq: Every day | ORAL | 4 refills | Status: DC

## 2023-01-20 NOTE — Progress Notes (Signed)
   GYN VISIT Patient name: Stacy Holder MRN [id number]  Date of birth: November 01, 1971 Chief Complaint:   No chief complaint on file.  History of Present Illness:   Stacy Holder is a 52 y.o. [phone] PH female being seen today for the following concerns:  HRT: currently on 2mg  daily.  Her PCP recommended that it might be time to stop.  She presents today to review this management.  With medication symptoms are 1/10, she may have an occasional hot flash, but they are tolerable. She has been on estradiol since surgery.  Over the past few mos she has also started to note vaginal dryness/pain with sex.  Previously this was never an issue; however, she had been told that vaginal estrogen would likely be needed.  -Previously seen for recurrent BV- treatment worked well.  Denies vaginal discharge, itching or irritation.  Overall very happy with results.  No LMP recorded. Patient has had a hysterectomy.     10/23/2018    9:01 AM 04/01/2017   12:13 PM  Depression screen PHQ 2/9  Decreased Interest 0 0  Down, Depressed, Hopeless 0 0  PHQ - 2 Score 0 0     Review of Systems:   Pertinent items are noted in HPI Denies fever/chills, dizziness, headaches, visual disturbances, fatigue, shortness of breath, chest pain, abdominal pain, vomiting, no problems with bowel movements, urination, or intercourse unless otherwise stated above.  Pertinent History Reviewed:  Reviewed past medical,surgical, social, obstetrical and family history.  Reviewed problem list, medications and allergies. Physical Assessment:   Vitals:   01/20/23 1610  BP: 125/81  Pulse: 82  Weight: 152 lb (68.9 kg)  Height: 5\' 3"  (1.6 m)  Body mass index is 26.93 kg/m.       Physical Examination:   General appearance: alert, well appearing, and in no distress  Psych: mood appropriate, normal affect  Skin: warm & dry   Cardiovascular: normal heart rate noted  Respiratory: normal respiratory effort, no distress  Abdomen:  soft, non-tender   Pelvic: examination not indicated  Extremities: no edema   Chaperone: N/A    Assessment & Plan:  1) HRT -discussed lowest dose for shortest time frame -since she is currently still having symptoms would advise against stopping medication -additionally, pt does not have any contraindications to continue -reviewed risk associated with HRT and why the desire to discontinue -pt agreeable of trial to decrease to 1mg  daily.  Goal to keep symptoms 3-4/10 or less []  will likely not plan to decrease any further this year OR if worsening of symptoms plan to return to 2mg  daily  2) Vaginal atrophy/dyspareunia -plan to start vaginal estrogen -discussed potential to continue vaginal treatment and ween from oral therapy -questions/concerns were addressed   Meds ordered this encounter  Medications   estradiol (ESTRACE) 1 MG tablet    Sig: Take 1 tablet (1 mg total) by mouth daily.    Dispense:  90 tablet    Refill:  4   estradiol (ESTRACE VAGINAL) 0.1 MG/GM vaginal cream    Sig: Pea-sized amount nightly for two weeks then twice per week    Dispense:  127.5 g    Refill:  6     Return in about 3 months (around 04/20/2023) for Medication follow up.   Janyth Pupa, DO Attending [place], [facility] for [employer], [place]

## 2023-01-21 DIAGNOSIS — Z79899 Other long term (current) drug therapy: Secondary | ICD-10-CM | POA: Diagnosis not present

## 2023-01-24 ENCOUNTER — Encounter: Payer: Self-pay | Admitting: Internal Medicine

## 2023-01-24 ENCOUNTER — Ambulatory Visit: Payer: BC Managed Care – PPO | Admitting: Internal Medicine

## 2023-01-24 VITALS — BP 112/78 | HR 77 | Ht 63.5 in | Wt 149.6 lb

## 2023-01-24 DIAGNOSIS — Z720 Tobacco use: Secondary | ICD-10-CM | POA: Insufficient documentation

## 2023-01-24 DIAGNOSIS — F320 Major depressive disorder, single episode, mild: Secondary | ICD-10-CM | POA: Insufficient documentation

## 2023-01-24 DIAGNOSIS — Z0001 Encounter for general adult medical examination with abnormal findings: Secondary | ICD-10-CM

## 2023-01-24 DIAGNOSIS — M5136 Other intervertebral disc degeneration, lumbar region: Secondary | ICD-10-CM

## 2023-01-24 DIAGNOSIS — Z1231 Encounter for screening mammogram for malignant neoplasm of breast: Secondary | ICD-10-CM

## 2023-01-24 DIAGNOSIS — Z90710 Acquired absence of both cervix and uterus: Secondary | ICD-10-CM

## 2023-01-24 DIAGNOSIS — Z23 Encounter for immunization: Secondary | ICD-10-CM

## 2023-01-24 DIAGNOSIS — F329 Major depressive disorder, single episode, unspecified: Secondary | ICD-10-CM

## 2023-01-24 DIAGNOSIS — F5000 Anorexia nervosa, unspecified: Secondary | ICD-10-CM | POA: Diagnosis not present

## 2023-01-24 MED ORDER — NICOTINE 10 MG IN INHA: 1.0000 | RESPIRATORY_TRACT | 0 refills | Status: DC | PRN

## 2023-01-24 NOTE — Assessment & Plan Note (Signed)
Reports history of TAH at age 52 and was started on estrogen therapy.  She is currently prescribed estradiol 1 mg daily as well as topical estrogen cream.  She states that her previous PCP had started weaning estrogen therapy given her age. -No changes today.  Agree with weaning estrogen therapy.

## 2023-01-24 NOTE — Progress Notes (Signed)
New Patient Office Visit  Subjective    Patient ID: Stacy Holder, female    DOB: 1971-06-23  Age: 52 y.o. MRN: [id number]  CC:  Chief Complaint  Patient presents with   Establish Care   HPI Stacy Holder presents to establish care.  She is a 52 year old woman who endorses a past medical history significant for anorexia nervosa, degenerative disc disease of lumbar spine, MDD, current tobacco use, and history of total abdominal hysterectomy currently on estrogen therapy.  She was previously followed at [facility] and is also followed by pain management in [place] (Dr. Leilani Merl).  [name] reports feeling well today.  She is asymptomatic and has no acute concerns to discuss.  She would like to establish care with a provider closer to home.  She currently works at [employer] as a Associate Professor.  She reports making tubelike amounts daily as well as occasional marijuana use.  She denies alcohol or illicit substance use.  Her family medical history is significant for DM, HTN, and breast cancer (half sister).  Acute concerns, chronic medical conditions, and outstanding preventative care items discussed today are individually addressed A/P below.  Outpatient Encounter Medications as of 01/24/2023  Medication Sig   Boric Acid Vaginal 600 MG SUPP Place 600 mg vaginally once a week.   cetirizine (ZYRTEC) 10 MG tablet Take 1 tablet (10 mg total) by mouth daily.   Cholecalciferol (VITAMIN D3) 50 MCG (2000 UT) TABS Take 1 tablet by mouth every morning.   cyclobenzaprine (FLEXERIL) 10 MG tablet Take 10 mg by mouth 3 (three) times daily as needed.   dronabinol (MARINOL) 2.5 MG capsule Take 2.5 mg by mouth 3 (three) times daily as needed.   estradiol (ESTRACE VAGINAL) 0.1 MG/GM vaginal cream Pea-sized amount nightly for two weeks then twice per week   estradiol (ESTRACE) 1 MG tablet Take 1 tablet (1 mg total) by mouth daily.   fluticasone (FLONASE) 50 MCG/ACT nasal spray Place 2 sprays  into both nostrils daily.   gabapentin (NEURONTIN) 100 MG capsule Take 100 mg by mouth 3 (three) times daily.   HYDROcodone-acetaminophen (NORCO/VICODIN) 5-325 MG tablet Take 1 tablet by mouth every 6 (six) hours as needed.   loperamide (IMODIUM) 2 MG capsule Take 1 capsule (2 mg total) by mouth 4 (four) times daily as needed for diarrhea or loose stools.   Multiple Vitamins-Minerals (EMERGEN-C IMMUNE PLUS) PACK Take 1 Package by mouth daily.   nicotine (NICOTROL) 10 MG inhaler Inhale 1 Cartridge (1 continuous puffing total) into the lungs as needed for smoking cessation.   promethazine-dextromethorphan (PROMETHAZINE-DM) 6.25-15 MG/5ML syrup Take 5 mLs by mouth 4 (four) times daily as needed for cough.   silver sulfADIAZINE (SILVADENE) 1 % cream Apply 1 application topically daily.   naloxone (NARCAN) nasal spray 4 mg/0.1 mL SMARTSIG:Both Nares (Patient not taking: Reported on 03/21/2022)   [DISCONTINUED] benzonatate (TESSALON) 100 MG capsule Take 1 capsule (100 mg total) by mouth 3 (three) times daily as needed for cough. Do not take with alcohol or while driving or operating heavy machinery. May cause drowsiness. (Patient not taking: Reported on 01/20/2023)   [DISCONTINUED] bismuth subsalicylate (PEPTO BISMOL) 262 MG/15ML suspension Take 30 mLs by mouth every 6 (six) hours as needed for indigestion or diarrhea or loose stools. (Patient not taking: Reported on 01/20/2023)   [DISCONTINUED] HYDROcodone-acetaminophen (NORCO/VICODIN) 5-325 MG tablet Take 1 tablet by mouth 5 (five) times daily as needed for pain (Patient not taking: Reported on 01/20/2023)   [  DISCONTINUED] oseltamivir (TAMIFLU) 75 MG capsule Take 1 capsule (75 mg total) by mouth every 12 (twelve) hours. (Patient not taking: Reported on 01/20/2023)   No facility-administered encounter medications on file as of 01/24/2023.    Past Medical History:  Diagnosis Date   BV (bacterial vaginosis) 11/24/2015   Kidney disease 12/2021   Rectal  pressure 11/24/2015   Rectocele 11/24/2015   Rectocele 11/24/2015   Vaginal discharge 11/24/2015   Vaginal odor 11/24/2015    Past Surgical History:  Procedure Laterality Date   ABDOMINAL HYSTERECTOMY     Hammer Toe Repair Left 05/30/2017   Left #5 Toe   TUBAL LIGATION      Family History  Problem Relation Age of Onset   Hypertension Mother    Diabetes Mother    Kidney failure Father    Diabetes Father    Hypertension Sister    Breast cancer Sister    Asthma Daughter    Diabetes Maternal Grandmother    Alzheimer's disease Paternal Grandmother     Social History   Socioeconomic History   Marital status: Single    Spouse name: Not on file   Number of children: 2   Years of education: Not on file   Highest education level: Not on file  Occupational History   Not on file  Tobacco Use   Smoking status: Former    Packs/day: 0.25    Years: 15.00    Total pack years: 3.75    Types: Cigarettes    Quit date: 10/08/2019    Years since quitting: 3.2   Smokeless tobacco: Never  Vaping Use   Vaping Use: Never used  Substance and Sexual Activity   Alcohol use: Yes    Comment: occ   Drug use: No   Sexual activity: Yes    Birth control/protection: Surgical    Comment: hyst  Other Topics Concern   Not on file  Social History Narrative   Not on file   Social Determinants of Health   Financial Resource Strain: Not on file  Food Insecurity: Not on file  Transportation Needs: Not on file  Physical Activity: Not on file  Stress: Not on file  Social Connections: Not on file  Intimate Partner Violence: Not on file   Review of Systems  Constitutional:  Negative for chills and fever.  HENT:  Negative for sore throat.   Respiratory:  Negative for cough and shortness of breath.   Cardiovascular:  Negative for chest pain, palpitations and leg swelling.  Gastrointestinal:  Negative for abdominal pain, blood in stool, constipation, diarrhea, nausea and vomiting.   Genitourinary:  Negative for dysuria and hematuria.  Musculoskeletal:  Negative for myalgias.  Skin:  Negative for itching and rash.  Neurological:  Negative for dizziness and headaches.  Psychiatric/Behavioral:  Negative for depression and suicidal ideas.    Objective    BP 112/78   Pulse 77   Ht 5' 3.5" (1.613 m)   Wt 149 lb 9.6 oz (67.9 kg)   SpO2 94%   BMI 26.08 kg/m   Physical Exam Vitals reviewed.  Constitutional:      General: She is not in acute distress.    Appearance: Normal appearance. She is not toxic-appearing.  HENT:     Head: Normocephalic and atraumatic.     Right Ear: External ear normal.     Left Ear: External ear normal.     Nose: Nose normal. No congestion or rhinorrhea.     Mouth/Throat:  Mouth: Mucous membranes are moist.     Pharynx: Oropharynx is clear. No oropharyngeal exudate or posterior oropharyngeal erythema.  Eyes:     General: No scleral icterus.    Extraocular Movements: Extraocular movements intact.     Conjunctiva/sclera: Conjunctivae normal.     Pupils: Pupils are equal, round, and reactive to light.  Cardiovascular:     Rate and Rhythm: Normal rate and regular rhythm.     Pulses: Normal pulses.     Heart sounds: Normal heart sounds. No murmur heard.    No friction rub. No gallop.  Pulmonary:     Effort: Pulmonary effort is normal.     Breath sounds: Normal breath sounds. No wheezing, rhonchi or rales.  Abdominal:     General: Abdomen is flat. Bowel sounds are normal. There is no distension.     Palpations: Abdomen is soft.     Tenderness: There is no abdominal tenderness.  Musculoskeletal:        General: No swelling. Normal range of motion.     Cervical back: Normal range of motion.     Right lower leg: No edema.     Left lower leg: No edema.  Lymphadenopathy:     Cervical: No cervical adenopathy.  Skin:    General: Skin is warm and dry.     Capillary Refill: Capillary refill takes less than 2 seconds.     Coloration:  Skin is not jaundiced.  Neurological:     General: No focal deficit present.     Mental Status: She is alert and oriented to person, place, and time.  Psychiatric:        Mood and Affect: Mood normal.        Behavior: Behavior normal.    Assessment & Plan:   Problem List Items Addressed This Visit       Degenerative disc disease, lumbar    History of DDD of the lumbar spine.  She is followed by pain management (Dr. Leilani Merl) and is prescribed Norco 5-325 mg every 6 hours as needed.  She is additionally prescribed gabapentin and Flexeril. -No medication changes today      Anorexia nervosa    Currently prescribed dronabinol.  Her weight today is 149 pounds, which she feels is fairly normal for her.  This has previously been managed by her PCP -No medication changes today      Depression, major, single episode, mild ([place])    She endorses a history of depression and is not currently on any antidepressant therapy.  She is interested in establishing care with counseling. -Integrated behavioral health referral placed today      H/O abdominal hysterectomy    Reports history of TAH at age 51 and was started on estrogen therapy.  She is currently prescribed estradiol 1 mg daily as well as topical estrogen cream.  She states that her previous PCP had started weaning estrogen therapy given her age. -No changes today.  Agree with weaning estrogen therapy.      Current tobacco use    Previously smoked cigarettes for 15 years before quitting in 2020.  She resumed smoking 2 black and mild cigars daily 1-1/2 years ago.  She is interested in cessation again and would like to use a Nicotrol inhaler as this was beneficial previously. -Nicotrol inhaler ordered today      Encounter for general adult medical examination with abnormal findings    Presenting today to establish care.  Available records and labs been reviewed. -Labs  recently updated by [facility].  We will request records  today -Influenza vaccine administered today.  Vaccines are otherwise up-to-date. -Screening mammogram ordered today -We will tentatively plan for follow-up in 3 months      Return in about 3 months (around 04/24/2023).   Johnette Abraham, MD

## 2023-01-24 NOTE — Assessment & Plan Note (Signed)
She endorses a history of depression and is not currently on any antidepressant therapy.  She is interested in establishing care with counseling. -Integrated behavioral health referral placed today

## 2023-01-24 NOTE — Assessment & Plan Note (Addendum)
Presenting today to establish care.  Available records and labs been reviewed. -Labs recently updated by [facility].  We will request records today -Influenza vaccine administered today.  Vaccines are otherwise up-to-date. -Screening mammogram ordered today -We will tentatively plan for follow-up in 3 months

## 2023-01-24 NOTE — Assessment & Plan Note (Signed)
History of DDD of the lumbar spine.  She is followed by pain management (Dr. Leilani Merl) and is prescribed Norco 5-325 mg every 6 hours as needed.  She is additionally prescribed gabapentin and Flexeril. -No medication changes today

## 2023-01-24 NOTE — Assessment & Plan Note (Signed)
Previously smoked cigarettes for 15 years before quitting in 2020.  She resumed smoking 2 black and mild cigars daily 1-1/2 years ago.  She is interested in cessation again and would like to use a Nicotrol inhaler as this was beneficial previously. -Nicotrol inhaler ordered today

## 2023-01-24 NOTE — Assessment & Plan Note (Signed)
Currently prescribed dronabinol.  Her weight today is 149 pounds, which she feels is fairly normal for her.  This has previously been managed by her PCP -No medication changes today

## 2023-01-24 NOTE — Patient Instructions (Signed)
It was a pleasure to see you today.  Thank you for giving [place] the opportunity to be involved in your care.  Below is a brief recap of your visit and next steps.  We will plan to see you again in 3 months.  Summary You have established care We will obtain your records from [facility] I have placed a referral for counseling Mammogram ordered today Nicotrol ordered for smoking cessation You will receive your flu shot Follow up in 3 months

## 2023-02-04 ENCOUNTER — Encounter: Payer: Self-pay | Admitting: Licensed Clinical Social Worker

## 2023-02-05 ENCOUNTER — Ambulatory Visit (INDEPENDENT_AMBULATORY_CARE_PROVIDER_SITE_OTHER): Payer: BC Managed Care – PPO | Admitting: Licensed Clinical Social Worker

## 2023-02-05 DIAGNOSIS — F320 Major depressive disorder, single episode, mild: Secondary | ICD-10-CM

## 2023-02-06 NOTE — BH Specialist Note (Signed)
Integrated Behavioral Health via Telemedicine Visit  02/06/2023 Stacy Holder [id number]  Number of [place] Clinician visits: 1 Session Start time:  9:00am Session End time: 9:49am Total time in minutes: 49 mins via mychart video   Referring Provider: Morene Crocker MD Patient/Family location: Home  [facility] Provider location: [place]  All persons participating in visit: Pt Stacy Holder and LCSW Stacy Holder  Types of Service: Individual psychotherapy and Video visit  I connected with Stacy Holder and/or Stacy Holder's n/Stacy via  Telephone or Video Enabled Telemedicine Application  (Video is Caregility application) and verified that I am speaking with the correct person using two identifiers. Discussed confidentiality: Yes   I discussed the limitations of telemedicine and the availability of in person appointments.  Discussed there is Stacy possibility of technology failure and discussed alternative modes of communication if that failure occurs.  I discussed that engaging in this telemedicine visit, they consent to the provision of behavioral healthcare and the services will be billed under their insurance.  Patient and/or legal guardian expressed understanding and consented to Telemedicine visit: Yes   Presenting Concerns: Patient and/or family reports the following symptoms/concerns: depression Duration of problem: over one year ; Severity of problem: mild  Patient and/or Family's Strengths/Protective Factors: Concrete supports in place (healthy food, safe environments, etc.)  Goals Addressed: Patient will:  Reduce symptoms of: depression   Increase knowledge and/or ability of: coping skills   Demonstrate ability to: Increase healthy adjustment to current life circumstances  Progress towards Goals: Ongoing  Interventions: Interventions utilized:  Motivational Interviewing and Supportive Counseling Standardized Assessments completed: Not Needed  Patient  and/or Family Response: Stacy Holder presents with Major Depression disorder. Stacy. Holder is tearful during video session. Stacy. Holder reports social isolation, depressed mood most days, anger, and negative thought patterns. Stacy. Holder denies thoughts of self harm.   Assessment: Patient currently experiencing major depressive disorder.   Patient may benefit from integrated behavioral health.  Plan: Follow up with behavioral health clinician on : 3 weeks  Behavioral recommendations: Reduce social isolation, maintain boundaries, mindfulness and positive affirmations  Referral(s): [name] (In Clinic)  I discussed the assessment and treatment plan with the patient and/or parent/guardian. They were provided an opportunity to ask questions and all were answered. They agreed with the plan and demonstrated an understanding of the instructions.   They were advised to call back or seek an in-person evaluation if the symptoms worsen or if the condition fails to improve as anticipated.  Lynnea Ferrier, LCSW

## 2023-02-14 DIAGNOSIS — Z79899 Other long term (current) drug therapy: Secondary | ICD-10-CM | POA: Diagnosis not present

## 2023-02-14 DIAGNOSIS — F339 Major depressive disorder, recurrent, unspecified: Secondary | ICD-10-CM | POA: Diagnosis not present

## 2023-02-14 DIAGNOSIS — Z6826 Body mass index (BMI) 26.0-26.9, adult: Secondary | ICD-10-CM | POA: Diagnosis not present

## 2023-02-14 DIAGNOSIS — M542 Cervicalgia: Secondary | ICD-10-CM | POA: Diagnosis not present

## 2023-02-14 DIAGNOSIS — G629 Polyneuropathy, unspecified: Secondary | ICD-10-CM | POA: Diagnosis not present

## 2023-02-14 DIAGNOSIS — M62838 Other muscle spasm: Secondary | ICD-10-CM | POA: Diagnosis not present

## 2023-02-18 DIAGNOSIS — Z79899 Other long term (current) drug therapy: Secondary | ICD-10-CM | POA: Diagnosis not present

## 2023-03-14 ENCOUNTER — Other Ambulatory Visit (HOSPITAL_COMMUNITY): Payer: Self-pay

## 2023-03-14 DIAGNOSIS — R630 Anorexia: Secondary | ICD-10-CM | POA: Diagnosis not present

## 2023-03-14 DIAGNOSIS — M62838 Other muscle spasm: Secondary | ICD-10-CM | POA: Diagnosis not present

## 2023-03-14 DIAGNOSIS — G629 Polyneuropathy, unspecified: Secondary | ICD-10-CM | POA: Diagnosis not present

## 2023-03-14 DIAGNOSIS — M542 Cervicalgia: Secondary | ICD-10-CM | POA: Diagnosis not present

## 2023-03-14 DIAGNOSIS — Z79899 Other long term (current) drug therapy: Secondary | ICD-10-CM | POA: Diagnosis not present

## 2023-03-14 DIAGNOSIS — E663 Overweight: Secondary | ICD-10-CM | POA: Diagnosis not present

## 2023-03-14 MED ORDER — DRONABINOL 2.5 MG PO CAPS
2.5000 mg | ORAL_CAPSULE | Freq: Three times a day (TID) | ORAL | 0 refills | Status: DC | PRN
  Filled 2023-03-14: qty 90, 30d supply, fill #0

## 2023-03-17 ENCOUNTER — Other Ambulatory Visit (HOSPITAL_COMMUNITY): Payer: Self-pay

## 2023-03-18 DIAGNOSIS — Z79899 Other long term (current) drug therapy: Secondary | ICD-10-CM | POA: Diagnosis not present

## 2023-03-19 ENCOUNTER — Other Ambulatory Visit (HOSPITAL_COMMUNITY): Payer: Self-pay

## 2023-03-20 ENCOUNTER — Other Ambulatory Visit (HOSPITAL_COMMUNITY): Payer: Self-pay

## 2023-03-21 ENCOUNTER — Other Ambulatory Visit (HOSPITAL_COMMUNITY): Payer: Self-pay

## 2023-03-24 ENCOUNTER — Telehealth: Payer: Self-pay | Admitting: Internal Medicine

## 2023-03-24 ENCOUNTER — Other Ambulatory Visit (HOSPITAL_COMMUNITY): Payer: Self-pay

## 2023-03-24 ENCOUNTER — Other Ambulatory Visit: Payer: Self-pay

## 2023-03-24 DIAGNOSIS — Z720 Tobacco use: Secondary | ICD-10-CM

## 2023-03-24 MED ORDER — NICOTINE 10 MG IN INHA: 1.0000 | RESPIRATORY_TRACT | 0 refills | Status: AC | PRN

## 2023-03-24 NOTE — Telephone Encounter (Signed)
Sent to [facility]

## 2023-03-24 NOTE — Telephone Encounter (Signed)
Pt needs nicotine (NICOTROL) 10 MG inhaler  Sent to [facility]

## 2023-03-25 ENCOUNTER — Other Ambulatory Visit (HOSPITAL_COMMUNITY): Payer: Self-pay

## 2023-03-27 ENCOUNTER — Other Ambulatory Visit (HOSPITAL_BASED_OUTPATIENT_CLINIC_OR_DEPARTMENT_OTHER): Payer: Self-pay

## 2023-03-27 ENCOUNTER — Encounter: Payer: Self-pay | Admitting: Internal Medicine

## 2023-03-27 ENCOUNTER — Other Ambulatory Visit (HOSPITAL_COMMUNITY): Payer: Self-pay

## 2023-03-27 ENCOUNTER — Ambulatory Visit: Payer: BC Managed Care – PPO | Admitting: Internal Medicine

## 2023-03-27 VITALS — BP 131/88 | HR 69 | Ht 63.5 in | Wt 149.0 lb

## 2023-03-27 DIAGNOSIS — B9689 Other specified bacterial agents as the cause of diseases classified elsewhere: Secondary | ICD-10-CM | POA: Diagnosis not present

## 2023-03-27 DIAGNOSIS — J019 Acute sinusitis, unspecified: Secondary | ICD-10-CM

## 2023-03-27 MED ORDER — AMOXICILLIN-POT CLAVULANATE 875-125 MG PO TABS: 1.0000 | ORAL_TABLET | Freq: Two times a day (BID) | ORAL | 0 refills | Status: AC

## 2023-03-27 MED ORDER — FLUTICASONE PROPIONATE 50 MCG/ACT NA SUSP: 2.0000 | Freq: Every day | NASAL | 0 refills | Status: AC

## 2023-03-27 NOTE — Progress Notes (Signed)
Acute Office Visit  Subjective:     Patient ID: Stacy Holder, female    DOB: 02-Jun-1971, 52 y.o.   MRN: [id number]  Chief Complaint  Patient presents with   Facial Pain    Pressure in face. Started on 03/10/2023. Can feel drainage and when spitting stuff up its got a green color to it.   Stacy Holder presents for an acute visit today endorsing a 2-week history of pain/pressure on the left side of her face.  She went to see her dentist on Monday and her dentist did not appreciate any signs of infection in her oral cavity.  Imaging was obtained and she was told it was suggestive of sinusitis.  Stacy Holder endorses green/yellow nasal secretions over this time.  Her symptoms have not improved with daily use of fluticasone.  She denies symptoms of fever/chills and cough/sputum production.  Review of Systems  HENT:  Positive for congestion and sinus pain.       Objective:    BP 131/88   Pulse 69   Ht 5' 3.5" (1.613 m)   Wt 149 lb (67.6 kg)   SpO2 96%   BMI 25.98 kg/m   Physical Exam Vitals reviewed.  Constitutional:      General: She is not in acute distress.    Appearance: Normal appearance. She is not ill-appearing or toxic-appearing.  HENT:     Head: Normocephalic and atraumatic.     Right Ear: External ear normal.     Left Ear: External ear normal.     Nose: Congestion present.     Comments: TTP over left maxillary sinus    Mouth/Throat:     Mouth: Mucous membranes are moist.     Pharynx: Oropharynx is clear. No oropharyngeal exudate or posterior oropharyngeal erythema.  Eyes:     Extraocular Movements: Extraocular movements intact.     Conjunctiva/sclera: Conjunctivae normal.     Pupils: Pupils are equal, round, and reactive to light.  Cardiovascular:     Rate and Rhythm: Normal rate and regular rhythm.     Pulses: Normal pulses.     Heart sounds: Normal heart sounds. No murmur heard.    No friction rub. No gallop.  Pulmonary:     Effort: Pulmonary effort is  normal.     Breath sounds: Normal breath sounds. No wheezing, rhonchi or rales.  Abdominal:     General: Abdomen is flat. Bowel sounds are normal. There is no distension.     Palpations: Abdomen is soft.     Tenderness: There is no abdominal tenderness.  Musculoskeletal:     Cervical back: Normal range of motion and neck supple.  Lymphadenopathy:     Cervical: No cervical adenopathy.  Skin:    General: Skin is warm and dry.     Coloration: Skin is not jaundiced.  Neurological:     Mental Status: She is alert.       Assessment & Plan:   Problem List Items Addressed This Visit       Acute bacterial sinusitis - Primary    Presenting today for an acute visit for evaluation of left maxillary sinus pain/pressure.  There is tenderness palpation on exam.  She endorses green/yellow nasal secretions.  Her history and exam findings today seem most consistent with acute bacterial sinusitis. -Augmentin x 5 days prescribed today -Recommended continued supportive care measures -She will return to care if her symptoms worsen or fail to improve       Meds  ordered this encounter  Medications   amoxicillin-clavulanate (AUGMENTIN) 875-125 MG tablet    Sig: Take 1 tablet by mouth 2 (two) times daily for 5 days.    Dispense:  10 tablet    Refill:  0   fluticasone (FLONASE) 50 MCG/ACT nasal spray    Sig: Place 2 sprays into both nostrils daily.    Dispense:  16 g    Refill:  0    Return if symptoms worsen or fail to improve.  Stacy Lade, MD

## 2023-03-27 NOTE — Patient Instructions (Signed)
It was a pleasure to see you today.  Thank you for giving [place] the opportunity to be involved in your care.  Below is a brief recap of your visit and next steps.  We will plan to see you again in May.  Summary Start Augmentin x 5 days for sinus infection Flonase refilled Follow up in late May

## 2023-03-27 NOTE — Assessment & Plan Note (Signed)
Presenting today for an acute visit for evaluation of left maxillary sinus pain/pressure.  There is tenderness palpation on exam.  She endorses green/yellow nasal secretions.  Her history and exam findings today seem most consistent with acute bacterial sinusitis. -Augmentin x 5 days prescribed today -Recommended continued supportive care measures -She will return to care if her symptoms worsen or fail to improve

## 2023-04-03 ENCOUNTER — Ambulatory Visit (HOSPITAL_COMMUNITY)
Admission: RE | Admit: 2023-04-03 | Discharge: 2023-04-03 | Disposition: A | Discharge status: Home / Self Care | Payer: BC Managed Care – PPO | Source: Ambulatory Visit | Attending: Internal Medicine | Admitting: Internal Medicine

## 2023-04-03 DIAGNOSIS — Z1231 Encounter for screening mammogram for malignant neoplasm of breast: Secondary | ICD-10-CM | POA: Insufficient documentation

## 2023-04-09 ENCOUNTER — Ambulatory Visit (INDEPENDENT_AMBULATORY_CARE_PROVIDER_SITE_OTHER): Payer: BC Managed Care – PPO | Admitting: Obstetrics & Gynecology

## 2023-04-09 ENCOUNTER — Encounter: Payer: Self-pay | Admitting: Obstetrics & Gynecology

## 2023-04-09 VITALS — BP 118/73 | HR 69 | Ht 63.0 in | Wt 151.6 lb

## 2023-04-09 DIAGNOSIS — N941 Unspecified dyspareunia: Secondary | ICD-10-CM

## 2023-04-09 DIAGNOSIS — N760 Acute vaginitis: Secondary | ICD-10-CM | POA: Diagnosis not present

## 2023-04-09 DIAGNOSIS — N952 Postmenopausal atrophic vaginitis: Secondary | ICD-10-CM | POA: Diagnosis not present

## 2023-04-09 DIAGNOSIS — Z90710 Acquired absence of both cervix and uterus: Secondary | ICD-10-CM

## 2023-04-09 DIAGNOSIS — Z79890 Hormone replacement therapy (postmenopausal): Secondary | ICD-10-CM | POA: Diagnosis not present

## 2023-04-09 MED ORDER — BORIC ACID VAGINAL 600 MG VA SUPP
600.0000 mg | VAGINAL | 4 refills | Status: AC
Start: 2023-04-09 — End: ?

## 2023-04-09 NOTE — Progress Notes (Signed)
   GYN VISIT Patient name: GERRY HEAPHY MRN [id number]  Date of birth: 10/09/1971 Chief Complaint:   Follow-up  History of Present Illness:   BOBETTA KORF is a 52 y.o. [id number] PH female being seen today for follow-up regarding:  HRT: At the last visit she was transition from estradiol 2 mg down to 1 mg as her plan is to slowly wean due to potential cardiovascular risks.  Currently symptoms are 6 out of 10 she does note moderate hot flashes.  She is not interested in going back to the higher dose of medication.    Dyspareunia: She started the vaginal estrogen cream but then forgot about it.  She has not yet restarted this medication.  Review over the past few months she has started to note vaginal dryness and pain with sex.   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  However she notes that she uses it when needed x 1 and symptoms typically resolve.  Last use of medication over a month ago  Today she denies vaginal discharge, itch, irritation.  Denies pelvic or abdominal pain.  Overall doing well and reports no acute GYN concerns.  No LMP recorded. Patient has had a hysterectomy.     03/27/2023    9:02 AM 01/24/2023   10:42 AM 10/23/2018    9:01 AM 04/01/2017   12:13 PM  Depression screen PHQ 2/9  Decreased Interest 0 0 0 0  Down, Depressed, Hopeless 0 0 0 0  PHQ - 2 Score 0 0 0 0  Altered sleeping 2 1    Tired, decreased energy 3 1    Change in appetite 0 2    Feeling bad or failure about yourself  0 0    Trouble concentrating 0 0    Moving slowly or fidgety/restless 0 1    Suicidal thoughts 0 0    PHQ-9 Score 5 5       Review of Systems:   Pertinent items are noted in HPI Denies fever/chills, dizziness, headaches, visual disturbances, fatigue, shortness of breath, chest pain, abdominal pain, vomiting. Pertinent History Reviewed:  Reviewed past medical,surgical, social, obstetrical and family history.  Reviewed problem list,  medications and allergies. Physical Assessment:   Vitals:   04/09/23 1400  BP: 118/73  Pulse: 69  Weight: 151 lb 9.6 oz (68.8 kg)  Height: 5\' 3"  (1.6 m)  Body mass index is 26.85 kg/m.       Physical Examination:   General appearance: alert, well appearing, and in no distress  Psych: mood appropriate, normal affect  Skin: warm & dry   Cardiovascular: normal heart rate noted  Respiratory: normal respiratory effort, no distress  Pelvic: examination not indicated  Extremities: no edema   Chaperone: N/A    Assessment & Plan:  1) HRT -Continue with current medication  2) Vaginal atrophy/Dyspareunia -Reviewed proper use of estrogen, okay to just start twice weekly  3) Recurrent BV -Continue with boric acid either weekly or as needed   Meds ordered this encounter  Medications   Boric Acid Vaginal 600 MG SUPP    Sig: Place 600 mg vaginally once a week.    Dispense:  12 suppository    Refill:  4      Return in about 1 year (around 04/08/2024) for Annual.   Myna Hidalgo, DO Attending Obstetrician & Gynecologist, [facility] for [facility], [facility]

## 2023-04-15 DIAGNOSIS — Z8659 Personal history of other mental and behavioral disorders: Secondary | ICD-10-CM | POA: Diagnosis not present

## 2023-04-15 DIAGNOSIS — Z79899 Other long term (current) drug therapy: Secondary | ICD-10-CM | POA: Diagnosis not present

## 2023-04-15 DIAGNOSIS — M542 Cervicalgia: Secondary | ICD-10-CM | POA: Diagnosis not present

## 2023-04-15 DIAGNOSIS — M546 Pain in thoracic spine: Secondary | ICD-10-CM | POA: Diagnosis not present

## 2023-04-15 DIAGNOSIS — R630 Anorexia: Secondary | ICD-10-CM | POA: Diagnosis not present

## 2023-04-15 DIAGNOSIS — E663 Overweight: Secondary | ICD-10-CM | POA: Diagnosis not present

## 2023-04-17 DIAGNOSIS — Z79899 Other long term (current) drug therapy: Secondary | ICD-10-CM | POA: Diagnosis not present

## 2023-04-22 ENCOUNTER — Other Ambulatory Visit (HOSPITAL_COMMUNITY): Payer: Self-pay

## 2023-04-24 ENCOUNTER — Other Ambulatory Visit (HOSPITAL_COMMUNITY): Payer: Self-pay

## 2023-05-02 ENCOUNTER — Encounter: Payer: Self-pay | Admitting: Internal Medicine

## 2023-05-02 ENCOUNTER — Ambulatory Visit: Payer: BC Managed Care – PPO | Admitting: Internal Medicine

## 2023-05-02 VITALS — BP 134/94 | HR 72 | Ht 63.5 in | Wt 151.0 lb

## 2023-05-02 DIAGNOSIS — Z23 Encounter for immunization: Secondary | ICD-10-CM | POA: Diagnosis not present

## 2023-05-02 DIAGNOSIS — F320 Major depressive disorder, single episode, mild: Secondary | ICD-10-CM

## 2023-05-02 DIAGNOSIS — Z720 Tobacco use: Secondary | ICD-10-CM | POA: Diagnosis not present

## 2023-05-02 NOTE — Patient Instructions (Signed)
It was a pleasure to see you today.  Thank you for giving [place] the opportunity to be involved in your care.  Below is a brief recap of your visit and next steps.  We will plan to see you again in 6 months.  Summary No medication changes today. I am glad to see that things are going well. We will plan for follow up in 6 months for repeat labs and your annual exam First shingles vaccine administered today

## 2023-05-02 NOTE — Progress Notes (Unsigned)
Established Patient Office Visit  Subjective   Patient ID: Stacy Holder, female    DOB: 07-05-1971  Age: 52 y.o. MRN: [id number]  Chief Complaint  Patient presents with   Immunizations    3 month follow up for health maintenance items. Shingles shot   Stacy Holder returns to care today for routine follow-up.  Last evaluated by me on 4/18 for an acute visit.  Treated for acute bacterial sinusitis.  In the interim she has been seen by gynecology for follow-up.  There have otherwise been no acute interval events.  Stacy Holder reports feeling well today.  She is asymptomatic and has no acute concerns to discuss.  Past Medical History:  Diagnosis Date   BV (bacterial vaginosis) 11/24/2015   Kidney disease 12/2021   Rectal pressure 11/24/2015   Rectocele 11/24/2015   Rectocele 11/24/2015   Vaginal discharge 11/24/2015   Vaginal odor 11/24/2015   Past Surgical History:  Procedure Laterality Date   ABDOMINAL HYSTERECTOMY     Hammer Toe Repair Left 05/30/2017   Left #5 Toe   TUBAL LIGATION     Social History   Tobacco Use   Smoking status: Former    Packs/day: 0.25    Years: 15.00    Additional pack years: 0.00    Total pack years: 3.75    Types: Cigarettes    Quit date: 10/08/2019    Years since quitting: 3.5   Smokeless tobacco: Never  Vaping Use   Vaping Use: Never used  Substance Use Topics   Alcohol use: Yes    Comment: occ   Drug use: No   Family History  Problem Relation Age of Onset   Hypertension Mother    Diabetes Mother    Kidney failure Father    Diabetes Father    Hypertension Sister    Breast cancer Sister    Asthma Daughter    Diabetes Maternal Grandmother    Alzheimer's disease Paternal Grandmother    Allergies  Allergen Reactions   Latex Other (See Comments)    blisters   Tape Rash    Cause a burn   Review of Systems  Constitutional:  Negative for chills and fever.  HENT:  Negative for sore throat.   Respiratory:  Negative for  cough and shortness of breath.   Cardiovascular:  Negative for chest pain, palpitations and leg swelling.  Gastrointestinal:  Negative for abdominal pain, blood in stool, constipation, diarrhea, nausea and vomiting.  Genitourinary:  Negative for dysuria and hematuria.  Musculoskeletal:  Negative for myalgias.  Skin:  Negative for itching and rash.  Neurological:  Negative for dizziness and headaches.  Psychiatric/Behavioral:  Negative for depression and suicidal ideas.      Objective:     BP (!) 134/94   Pulse 72   Ht 5' 3.5" (1.613 m)   Wt 151 lb (68.5 kg)   SpO2 98%   BMI 26.33 kg/m  BP Readings from Last 3 Encounters:  05/02/23 (!) 134/94  04/09/23 118/73  03/27/23 131/88   Physical Exam Vitals reviewed.  Constitutional:      General: She is not in acute distress.    Appearance: Normal appearance. She is not toxic-appearing.  HENT:     Head: Normocephalic and atraumatic.     Right Ear: External ear normal.     Left Ear: External ear normal.     Nose: Nose normal. No congestion or rhinorrhea.     Mouth/Throat:     Mouth: Mucous membranes  are moist.     Pharynx: Oropharynx is clear. No oropharyngeal exudate or posterior oropharyngeal erythema.  Eyes:     General: No scleral icterus.    Extraocular Movements: Extraocular movements intact.     Conjunctiva/sclera: Conjunctivae normal.     Pupils: Pupils are equal, round, and reactive to light.  Cardiovascular:     Rate and Rhythm: Normal rate and regular rhythm.     Pulses: Normal pulses.     Heart sounds: Normal heart sounds. No murmur heard.    No friction rub. No gallop.  Pulmonary:     Effort: Pulmonary effort is normal.     Breath sounds: Normal breath sounds. No wheezing, rhonchi or rales.  Abdominal:     General: Abdomen is flat. Bowel sounds are normal. There is no distension.     Palpations: Abdomen is soft.     Tenderness: There is no abdominal tenderness.  Musculoskeletal:        General: No swelling.  Normal range of motion.     Cervical back: Normal range of motion.     Right lower leg: No edema.     Left lower leg: No edema.  Lymphadenopathy:     Cervical: No cervical adenopathy.  Skin:    General: Skin is warm and dry.     Capillary Refill: Capillary refill takes less than 2 seconds.     Coloration: Skin is not jaundiced.  Neurological:     General: No focal deficit present.     Mental Status: She is alert and oriented to person, place, and time.  Psychiatric:        Mood and Affect: Mood normal.        Behavior: Behavior normal.      Assessment & Plan:   Problem List Items Addressed This Visit       Depression, major, single episode, mild (HCC)    Previously referred to integrated behavioral health for counseling services.  She completed 1 session, which she states was beneficial.  She feels that her mood has improved and she is back to doing things that she enjoyed doing previously.  She is traveling frequently and has been focusing on herself. -No additional treatment today      Current tobacco use    She continues to smoke black and mild cigars but remains interested in smoking cessation.  She has been using a Nicotrol inhaler and has set a quit date for this Saturday (5/25) at midnight. -Patient was congratulated on her progress with smoking cessation, including selecting a quit date.  Complete cessation was again encouraged today.      Need for zoster vaccination    Zoster vaccine administered today      Return in about 6 months (around 11/02/2023) for CPE.   Stacy Lade, MD

## 2023-05-08 ENCOUNTER — Encounter: Payer: Self-pay | Admitting: Internal Medicine

## 2023-05-08 DIAGNOSIS — Z23 Encounter for immunization: Secondary | ICD-10-CM | POA: Insufficient documentation

## 2023-05-08 NOTE — Assessment & Plan Note (Addendum)
Previously referred to integrated behavioral health for counseling services.  She completed 1 session, which she states was beneficial.  She feels that her mood has improved and she is back to doing things that she enjoyed doing previously.  She is traveling frequently and has been focusing on herself. -No additional treatment today

## 2023-05-08 NOTE — Assessment & Plan Note (Signed)
She continues to smoke black and mild cigars but remains interested in smoking cessation.  She has been using a Nicotrol inhaler and has set a quit date for this Saturday (5/25) at midnight. -Patient was congratulated on her progress with smoking cessation, including selecting a quit date.  Complete cessation was again encouraged today.

## 2023-05-08 NOTE — Assessment & Plan Note (Signed)
Zoster vaccine administered today 

## 2023-05-20 DIAGNOSIS — M542 Cervicalgia: Secondary | ICD-10-CM | POA: Diagnosis not present

## 2023-05-20 DIAGNOSIS — R630 Anorexia: Secondary | ICD-10-CM | POA: Diagnosis not present

## 2023-05-20 DIAGNOSIS — Z8659 Personal history of other mental and behavioral disorders: Secondary | ICD-10-CM | POA: Diagnosis not present

## 2023-05-20 DIAGNOSIS — G629 Polyneuropathy, unspecified: Secondary | ICD-10-CM | POA: Diagnosis not present

## 2023-05-20 DIAGNOSIS — E663 Overweight: Secondary | ICD-10-CM | POA: Diagnosis not present

## 2023-05-20 DIAGNOSIS — Z79899 Other long term (current) drug therapy: Secondary | ICD-10-CM | POA: Diagnosis not present

## 2023-05-22 DIAGNOSIS — Z79899 Other long term (current) drug therapy: Secondary | ICD-10-CM | POA: Diagnosis not present

## 2023-06-17 DIAGNOSIS — Z6827 Body mass index (BMI) 27.0-27.9, adult: Secondary | ICD-10-CM | POA: Diagnosis not present

## 2023-06-17 DIAGNOSIS — M542 Cervicalgia: Secondary | ICD-10-CM | POA: Diagnosis not present

## 2023-06-17 DIAGNOSIS — Z79899 Other long term (current) drug therapy: Secondary | ICD-10-CM | POA: Diagnosis not present

## 2023-06-17 DIAGNOSIS — M62838 Other muscle spasm: Secondary | ICD-10-CM | POA: Diagnosis not present

## 2023-06-17 DIAGNOSIS — G629 Polyneuropathy, unspecified: Secondary | ICD-10-CM | POA: Diagnosis not present

## 2023-06-18 DIAGNOSIS — Z8669 Personal history of other diseases of the nervous system and sense organs: Secondary | ICD-10-CM | POA: Diagnosis not present

## 2023-06-18 DIAGNOSIS — H93293 Other abnormal auditory perceptions, bilateral: Secondary | ICD-10-CM | POA: Diagnosis not present

## 2023-06-18 DIAGNOSIS — H6123 Impacted cerumen, bilateral: Secondary | ICD-10-CM | POA: Diagnosis not present

## 2023-06-19 DIAGNOSIS — Z79899 Other long term (current) drug therapy: Secondary | ICD-10-CM | POA: Diagnosis not present

## 2023-07-15 DIAGNOSIS — M62838 Other muscle spasm: Secondary | ICD-10-CM | POA: Diagnosis not present

## 2023-07-15 DIAGNOSIS — Z8659 Personal history of other mental and behavioral disorders: Secondary | ICD-10-CM | POA: Diagnosis not present

## 2023-07-15 DIAGNOSIS — R630 Anorexia: Secondary | ICD-10-CM | POA: Diagnosis not present

## 2023-07-15 DIAGNOSIS — E663 Overweight: Secondary | ICD-10-CM | POA: Diagnosis not present

## 2023-07-15 DIAGNOSIS — M542 Cervicalgia: Secondary | ICD-10-CM | POA: Diagnosis not present

## 2023-07-15 DIAGNOSIS — Z79899 Other long term (current) drug therapy: Secondary | ICD-10-CM | POA: Diagnosis not present

## 2023-07-17 DIAGNOSIS — Z79899 Other long term (current) drug therapy: Secondary | ICD-10-CM | POA: Diagnosis not present

## 2023-07-22 ENCOUNTER — Other Ambulatory Visit: Payer: Self-pay

## 2023-07-22 ENCOUNTER — Telehealth: Payer: Self-pay | Admitting: Internal Medicine

## 2023-07-22 ENCOUNTER — Ambulatory Visit (INDEPENDENT_AMBULATORY_CARE_PROVIDER_SITE_OTHER): Payer: BC Managed Care – PPO

## 2023-07-22 DIAGNOSIS — Z23 Encounter for immunization: Secondary | ICD-10-CM

## 2023-07-22 MED ORDER — CETIRIZINE HCL 10 MG PO TABS: 10.0000 mg | ORAL_TABLET | Freq: Every day | ORAL | 0 refills | Status: DC

## 2023-07-22 NOTE — Telephone Encounter (Signed)
Medication sent to pharmacy  

## 2023-07-22 NOTE — Telephone Encounter (Signed)
Patient came by the office and need the medicine re write due to new pcp in our office per patient.  cetirizine (ZYRTEC) 10 MG tablet [[id number]]   Patient no longer from pcp [facility]itt[facility].  Pharmacy: [facility]

## 2023-08-21 DIAGNOSIS — M62838 Other muscle spasm: Secondary | ICD-10-CM | POA: Diagnosis not present

## 2023-08-21 DIAGNOSIS — Z79899 Other long term (current) drug therapy: Secondary | ICD-10-CM | POA: Diagnosis not present

## 2023-08-21 DIAGNOSIS — M542 Cervicalgia: Secondary | ICD-10-CM | POA: Diagnosis not present

## 2023-08-21 DIAGNOSIS — R630 Anorexia: Secondary | ICD-10-CM | POA: Diagnosis not present

## 2023-08-21 DIAGNOSIS — E663 Overweight: Secondary | ICD-10-CM | POA: Diagnosis not present

## 2023-08-21 DIAGNOSIS — G629 Polyneuropathy, unspecified: Secondary | ICD-10-CM | POA: Diagnosis not present

## 2023-08-25 DIAGNOSIS — Z79899 Other long term (current) drug therapy: Secondary | ICD-10-CM | POA: Diagnosis not present

## 2023-11-04 ENCOUNTER — Ambulatory Visit: Payer: BC Managed Care – PPO | Admitting: Internal Medicine

## 2023-11-19 DIAGNOSIS — Z8669 Personal history of other diseases of the nervous system and sense organs: Secondary | ICD-10-CM | POA: Diagnosis not present

## 2023-11-19 DIAGNOSIS — H6123 Impacted cerumen, bilateral: Secondary | ICD-10-CM | POA: Diagnosis not present

## 2023-11-19 DIAGNOSIS — J309 Allergic rhinitis, unspecified: Secondary | ICD-10-CM | POA: Diagnosis not present

## 2023-11-19 DIAGNOSIS — H6991 Unspecified Eustachian tube disorder, right ear: Secondary | ICD-10-CM | POA: Diagnosis not present

## 2023-11-25 ENCOUNTER — Other Ambulatory Visit (HOSPITAL_COMMUNITY)
Admission: RE | Admit: 2023-11-25 | Discharge: 2023-11-25 | Disposition: A | Discharge status: Home / Self Care | Payer: BC Managed Care – PPO | Source: Ambulatory Visit | Attending: Obstetrics & Gynecology | Admitting: Obstetrics & Gynecology

## 2023-11-25 ENCOUNTER — Other Ambulatory Visit (INDEPENDENT_AMBULATORY_CARE_PROVIDER_SITE_OTHER): Payer: BC Managed Care – PPO | Admitting: *Deleted

## 2023-11-25 DIAGNOSIS — N898 Other specified noninflammatory disorders of vagina: Secondary | ICD-10-CM

## 2023-11-25 NOTE — Progress Notes (Signed)
   NURSE VISIT- VAGINITIS/STD  SUBJECTIVE:  Stacy Holder is a 52 y.o. [id number] GYN patientfemale here for a vaginal swab for vaginitis screening, STD screen.  She reports the following symptoms:  vaginal itching and irritation  for 2 days. Denies abnormal vaginal bleeding, significant pelvic pain, fever, or UTI symptoms.  OBJECTIVE:  There were no vitals taken for this visit.  Appears well, in no apparent distress  ASSESSMENT: Vaginal swab for vaginitis screening & STD screening  PLAN: Self-collected vaginal probe for Gonorrhea, Chlamydia, Trichomonas, Bacterial Vaginosis, Yeast sent to lab Treatment: to be determined once results are received Follow-up as needed if symptoms persist/worsen, or new symptoms develop  Malachy Mood  11/25/2023 3:22 PM

## 2023-11-26 ENCOUNTER — Telehealth (INDEPENDENT_AMBULATORY_CARE_PROVIDER_SITE_OTHER): Payer: BC Managed Care – PPO | Admitting: Internal Medicine

## 2023-11-26 ENCOUNTER — Encounter: Payer: Self-pay | Admitting: Internal Medicine

## 2023-11-26 DIAGNOSIS — R5383 Other fatigue: Secondary | ICD-10-CM | POA: Diagnosis not present

## 2023-11-26 DIAGNOSIS — G44209 Tension-type headache, unspecified, not intractable: Secondary | ICD-10-CM | POA: Diagnosis not present

## 2023-11-26 NOTE — Assessment & Plan Note (Signed)
Intermittently present x 2 weeks.  Recently seen by ENT, who felt this was related to sinus congestion.  She was prescribed fluticasone nasal spray and cetirizine.  She reports that she just started fluticasone within the last 2 days.  For now, she would like to remain on fluticasone and Zyrtec.  She will follow-up in 1-2 weeks if symptoms do not improve.  I additionally recommended as needed use of ibuprofen for headache relief.

## 2023-11-26 NOTE — Assessment & Plan Note (Signed)
Unclear etiology.  She describes feeling more tired than usual as of late.  She does not feel rested upon waking up and states that she has not been active enough to feel as tired as she does. -Will order repeat labs today to screen for underlying metabolic etiologies.  She is scheduled for routine follow-up with me on 1/28.  Further workup pending initial lab results.

## 2023-11-26 NOTE — Progress Notes (Signed)
Virtual Visit via Video Note  I connected with Stacy Holder on 11/26/23 at  1:00 PM EST by a video enabled telemedicine application and verified that I am speaking with the correct person using two identifiers.  Patient Location: Home Provider Location: Office/Clinic  I discussed the limitations, risks, security, and privacy concerns of performing an evaluation and management service by video and the availability of in person appointments. I also discussed with the patient that there may be a patient responsible charge related to this service. The patient expressed understanding and agreed to proceed.  Subjective: PCP: Billie Lade, MD  Chief Complaint  Patient presents with   Headache    Patient complains of headache starting two weeks ago that is not resolved with medication. States her ENT put her on flonase and zyrtec thinking it was sinus related but it is ongoing.    Stacy Holder has been evaluated today for an acute visit through video encounter endorsing at least a 2-week history of headaches.  She describes a tension headache across her forehead that has been intermittently present.  She endorses associated photophobia.  She recently saw ENT, who thought this may be related to sinus congestion.  She was prescribed fluticasone nasal spray and cetirizine.  She reports that she just started using fluticasone.  She would like to try using this for the next several days and will follow-up with our office if further treatment is needed.  Her additional concern today is recent fatigue.  She states that she does not feel rested upon waking up and states that she has not been active enough to feel as tired as she does.  She denies fever/chills, cough/sputum production, and is unaware of any recent sick contacts.  ROS: Per HPI  Current Outpatient Medications:    Boric Acid Vaginal 600 MG SUPP, Place 600 mg vaginally once a week., Disp: 12 suppository, Rfl: 4   cetirizine (ZYRTEC)  10 MG tablet, Take 1 tablet (10 mg total) by mouth daily., Disp: 30 tablet, Rfl: 0   Cholecalciferol (VITAMIN D3) 50 MCG (2000 UT) TABS, Take 1 tablet by mouth every morning., Disp: , Rfl:    cyclobenzaprine (FLEXERIL) 10 MG tablet, Take 10 mg by mouth 3 (three) times daily as needed., Disp: , Rfl:    estradiol (ESTRACE VAGINAL) 0.1 MG/GM vaginal cream, Pea-sized amount nightly for two weeks then twice per week, Disp: 127.5 g, Rfl: 6   fluticasone (FLONASE) 50 MCG/ACT nasal spray, Place 2 sprays into both nostrils daily., Disp: 16 g, Rfl: 0   gabapentin (NEURONTIN) 100 MG capsule, Take 100 mg by mouth 3 (three) times daily., Disp: , Rfl:    HYDROcodone-acetaminophen (NORCO/VICODIN) 5-325 MG tablet, Take 1 tablet by mouth every 6 (six) hours as needed., Disp: , Rfl:    loperamide (IMODIUM) 2 MG capsule, Take 1 capsule (2 mg total) by mouth 4 (four) times daily as needed for diarrhea or loose stools., Disp: 12 capsule, Rfl: 0   Multiple Vitamins-Minerals (EMERGEN-C IMMUNE PLUS) PACK, Take 1 Package by mouth daily., Disp: , Rfl:    naloxone (NARCAN) nasal spray 4 mg/0.1 mL, , Disp: , Rfl:    nicotine (NICOTROL) 10 MG inhaler, Inhale 1 Cartridge (1 continuous puffing total) into the lungs as needed for smoking cessation., Disp: 42 each, Rfl: 0   promethazine-dextromethorphan (PROMETHAZINE-DM) 6.25-15 MG/5ML syrup, Take 5 mLs by mouth 4 (four) times daily as needed for cough., Disp: 118 mL, Rfl: 0   silver sulfADIAZINE (SILVADENE) 1 %  cream, Apply 1 application topically daily., Disp: 50 g, Rfl: 0   estradiol (ESTRACE) 1 MG tablet, Take 1 tablet (1 mg total) by mouth daily., Disp: 90 tablet, Rfl: 4  Assessment and Plan:  Tension headache Assessment & Plan: Intermittently present x 2 weeks.  Recently seen by ENT, who felt this was related to sinus congestion.  She was prescribed fluticasone nasal spray and cetirizine.  She reports that she just started fluticasone within the last 2 days.  For now, she  would like to remain on fluticasone and Zyrtec.  She will follow-up in 1-2 weeks if symptoms do not improve.  I additionally recommended as needed use of ibuprofen for headache relief.   Other fatigue Assessment & Plan: Unclear etiology.  She describes feeling more tired than usual as of late.  She does not feel rested upon waking up and states that she has not been active enough to feel as tired as she does. -Will order repeat labs today to screen for underlying metabolic etiologies.  She is scheduled for routine follow-up with me on 1/28.  Further workup pending initial lab results.  Follow Up Instructions: Return if symptoms worsen or fail to improve.   I discussed the assessment and treatment plan with the patient. The patient was provided an opportunity to ask questions, and all were answered. The patient agreed with the plan and demonstrated an understanding of the instructions.   The patient was advised to call back or seek an in-person evaluation if the symptoms worsen or if the condition fails to improve as anticipated.  The above assessment and management plan was discussed with the patient. The patient verbalized understanding of and has agreed to the management plan.   Billie Lade, MD

## 2023-11-27 LAB — CERVICOVAGINAL ANCILLARY ONLY
Bacterial Vaginitis (gardnerella): POSITIVE — AB
Candida Glabrata: NEGATIVE
Candida Vaginitis: NEGATIVE
Chlamydia: NEGATIVE
Comment: NEGATIVE
Comment: NEGATIVE
Comment: NEGATIVE
Comment: NEGATIVE
Comment: NEGATIVE
Comment: NORMAL
Neisseria Gonorrhea: NEGATIVE
Trichomonas: NEGATIVE

## 2023-11-28 ENCOUNTER — Other Ambulatory Visit: Payer: Self-pay | Admitting: Adult Health

## 2023-11-28 MED ORDER — METRONIDAZOLE 500 MG PO TABS: 500.0000 mg | ORAL_TABLET | Freq: Two times a day (BID) | ORAL | 0 refills | Status: DC

## 2023-11-28 NOTE — Progress Notes (Signed)
+  BV on vaginal swab, will rx flagyl, no sex or alcohol while taking  

## 2023-12-04 ENCOUNTER — Ambulatory Visit: Payer: BC Managed Care – PPO | Admitting: Internal Medicine

## 2023-12-22 ENCOUNTER — Encounter (HOSPITAL_COMMUNITY): Payer: Self-pay | Admitting: Emergency Medicine

## 2023-12-22 ENCOUNTER — Emergency Department (HOSPITAL_COMMUNITY): Payer: BC Managed Care – PPO

## 2023-12-22 ENCOUNTER — Emergency Department (HOSPITAL_COMMUNITY)
Admission: EM | Admit: 2023-12-22 | Discharge: 2023-12-22 | Disposition: A | Discharge status: Home / Self Care | Payer: BC Managed Care – PPO | Attending: Emergency Medicine | Admitting: Emergency Medicine

## 2023-12-22 DIAGNOSIS — M25572 Pain in left ankle and joints of left foot: Secondary | ICD-10-CM | POA: Diagnosis not present

## 2023-12-22 DIAGNOSIS — S8265XA Nondisplaced fracture of lateral malleolus of left fibula, initial encounter for closed fracture: Secondary | ICD-10-CM | POA: Insufficient documentation

## 2023-12-22 DIAGNOSIS — Z91040 Latex allergy status: Secondary | ICD-10-CM | POA: Diagnosis not present

## 2023-12-22 DIAGNOSIS — S8252XA Displaced fracture of medial malleolus of left tibia, initial encounter for closed fracture: Secondary | ICD-10-CM | POA: Insufficient documentation

## 2023-12-22 DIAGNOSIS — W000XXA Fall on same level due to ice and snow, initial encounter: Secondary | ICD-10-CM | POA: Diagnosis not present

## 2023-12-22 DIAGNOSIS — Y92009 Unspecified place in unspecified non-institutional (private) residence as the place of occurrence of the external cause: Secondary | ICD-10-CM | POA: Diagnosis not present

## 2023-12-22 DIAGNOSIS — M7732 Calcaneal spur, left foot: Secondary | ICD-10-CM | POA: Diagnosis not present

## 2023-12-22 DIAGNOSIS — S8255XA Nondisplaced fracture of medial malleolus of left tibia, initial encounter for closed fracture: Secondary | ICD-10-CM | POA: Diagnosis not present

## 2023-12-22 MED ORDER — OXYCODONE-ACETAMINOPHEN 5-325 MG PO TABS
1.0000 | ORAL_TABLET | Freq: Once | ORAL | Status: AC
  Administered 2023-12-22: 1 via ORAL
  Filled 2023-12-22: qty 1

## 2023-12-22 MED ORDER — OXYCODONE-ACETAMINOPHEN 5-325 MG PO TABS: 1.0000 | ORAL_TABLET | Freq: Four times a day (QID) | ORAL | 0 refills | Status: DC | PRN

## 2023-12-22 MED ORDER — HYDROMORPHONE HCL 1 MG/ML IJ SOLN
0.5000 mg | Freq: Once | INTRAMUSCULAR | Status: AC
  Administered 2023-12-22: 0.5 mg via SUBCUTANEOUS
  Filled 2023-12-22: qty 0.5

## 2023-12-22 NOTE — ED Triage Notes (Signed)
 Pt slipped on ice today and endorses pain to left ankle. States she was unable to put weight on it later.

## 2023-12-22 NOTE — ED Provider Notes (Signed)
 [place] EMERGENCY DEPARTMENT AT [facility] Provider Note   CSN: [id number] Arrival date & time: 12/22/23  1443     History  Chief Complaint  Patient presents with   Fall   Ankle Pain    Stacy Holder is a 53 y.o. female, who presents to the ED secondary to left ankle pain, x 1 day.  She states she was going up her steps, at home, yesterday night, when she slipped on a patch of ice, fell and twisted her ankle.  She attempted to catch her saw, but fell on the ankle.  She states since then she has had some pain, and she reports some ankle swelling, and she is having difficult time bearing weight on it.  She has put a heating pad on it without relief.  Denies any numbness, tingling of the foot.  No change in color of foot.  No use of blood thinners.  No head trauma, or loss of consciousness.  Denies any other pain    Home Medications Prior to Admission medications   Medication Sig Start Date End Date Taking? Authorizing Provider  metroNIDAZOLE  (FLAGYL ) 500 MG tablet Take 1 tablet (500 mg total) by mouth 2 (two) times daily. 11/28/23   Griffin, Jennifer A, NP  oxyCODONE -acetaminophen  (PERCOCET/ROXICET) 5-325 MG tablet Take 1 tablet by mouth every 6 (six) hours as needed for severe pain (pain score 7-10). 12/22/23  Yes Addysyn Fern L, PA  Boric Acid Vaginal 600 MG SUPP Place 600 mg vaginally once a week. 04/09/23   Ozan, Jennifer, DO  cetirizine  (ZYRTEC ) 10 MG tablet Take 1 tablet (10 mg total) by mouth daily. 07/22/23   Dixon, Phillip E, MD  Cholecalciferol (VITAMIN D3) 50 MCG (2000 UT) TABS Take 1 tablet by mouth every morning.    [provider]  cyclobenzaprine (FLEXERIL) 10 MG tablet Take 10 mg by mouth 3 (three) times daily as needed. 01/08/21   [provider]  estradiol  (ESTRACE  VAGINAL) 0.1 MG/GM vaginal cream Pea-sized amount nightly for two weeks then twice per week 01/20/23   Ozan, Jennifer, DO  estradiol  (ESTRACE ) 1 MG tablet Take 1 tablet (1 mg  total) by mouth daily. 01/20/23 05/02/23  Ozan, Jennifer, DO  fluticasone  (FLONASE ) 50 MCG/ACT nasal spray Place 2 sprays into both nostrils daily. 03/27/23   Dixon, Phillip E, MD  gabapentin (NEURONTIN) 100 MG capsule Take 100 mg by mouth 3 (three) times daily. 01/08/21   [provider]  HYDROcodone -acetaminophen  (NORCO/VICODIN) 5-325 MG tablet Take 1 tablet by mouth every 6 (six) hours as needed. 01/07/22   [provider]  loperamide  (IMODIUM ) 2 MG capsule Take 1 capsule (2 mg total) by mouth 4 (four) times daily as needed for diarrhea or loose stools. 01/03/23   White, Shelba SAUNDERS, NP  Multiple Vitamins-Minerals (EMERGEN-C IMMUNE PLUS) PACK Take 1 Package by mouth daily.    [provider]  naloxone [facility]) nasal spray 4 mg/0.1 mL  03/19/22   [provider]  nicotine  (NICOTROL ) 10 MG inhaler Inhale 1 Cartridge (1 continuous puffing total) into the lungs as needed for smoking cessation. 03/24/23   Melvenia Manus BRAVO, MD  promethazine -dextromethorphan (PROMETHAZINE -DM) 6.25-15 MG/5ML syrup Take 5 mLs by mouth 4 (four) times daily as needed for cough. 01/03/23   Teresa Shelba SAUNDERS, NP  silver  sulfADIAZINE  (SILVADENE ) 1 % cream Apply 1 application topically daily. 07/06/20   Signa Delon LABOR, NP      Allergies    Latex and Tape  Review of Systems   Review of Systems  Musculoskeletal:  Positive for joint swelling. Negative for neck pain.    Physical Exam Updated Vital Signs BP (!) 131/90 (BP Location: Right Arm)   Pulse (!) 104   Temp 98.9 F (37.2 C) (Oral)   Resp 18   Ht 5' 3.5 (1.613 m)   Wt 68 kg   SpO2 98%   BMI 26.14 kg/m  Physical Exam Vitals and nursing note reviewed.  Constitutional:      General: She is not in acute distress.    Appearance: She is well-developed.  HENT:     Head: Normocephalic and atraumatic.  Eyes:     General:        Right eye: No discharge.        Left eye: No discharge.     Conjunctiva/sclera: Conjunctivae normal.   Pulmonary:     Effort: No respiratory distress.  Musculoskeletal:     Comments: Left ankle: TTP of both lateral and medial malleolus. Edema noted to ankle. Difficulty bearing weight. Able to plantar flex and dorsiflex ankle. Inversion/eversion intact. Negative Thompson test. No midfoot or base of 5th metatarsal tenderness to palpation. Capillary refill <2sec. Dorsalis pedis pulse present. No foot drop noted. Sensation intact. Warm to touch.    Neurological:     Mental Status: She is alert.     Comments: Clear speech.   Psychiatric:        Behavior: Behavior normal.        Thought Content: Thought content normal.     ED Results / Procedures / Treatments   Labs (all labs ordered are listed, but only abnormal results are displayed) Labs Reviewed - No data to display  EKG None  Radiology DG Ankle Complete Left Result Date: 12/22/2023 CLINICAL DATA:  Fall.  Swelling. EXAM: LEFT ANKLE COMPLETE - 3+ VIEW COMPARISON:  04/30/2004. FINDINGS: Bone mineralization within normal limits for patient's age. There is an oblique undisplaced fracture of the lateral malleolus with moderate overlying soft tissue swelling. There is also a tiny avulsion fracture along the tip of the medial malleolus, of indeterminate age. Correlate with point tenderness. No other acute fracture or dislocation. No aggressive osseous lesion. Ankle mortise appears intact. Calcaneal spur noted along the Achilles tendon attachment site. No focal soft tissue swelling. No radiopaque foreign bodies. IMPRESSION: *Oblique undisplaced acute fracture of the lateral malleolus. *Tiny avulsion fracture along the tip of the medial malleolus, of indeterminate age. Electronically Signed   By: Ree Molt M.D.   On: 12/22/2023 16:28    Procedures Procedures    Medications Ordered in ED Medications  oxyCODONE -acetaminophen  (PERCOCET/ROXICET) 5-325 MG per tablet 1 tablet (1 tablet Oral Given 12/22/23 1904)    ED Course/ Medical Decision  Making/ A&P                                 Medical Decision Making Is a 53 year old female, here with left ankle pain, tender to the both the medial and lateral malleolus.  Some edema noted.  Positive dorsalis pedis pulse, sensation intact.  No midfoot tenderness, will obtain x-ray of ankle, give Percocet for pain control.  Amount and/or Complexity of Data Reviewed Radiology: ordered.    Details: Findings concerning for oblique undisplaced acute fracture of the lateral malleolus, and a tiny avulsion fracture along the tip of the medial malleolus Discussion of management or test interpretation with external provider(s): She has  tenderness to palpation of bilateral malleoli, she is found to have a acute fracture of the lateral malleolus, as well as a Anye Brose avulsion fracture of the medial malleolus.  Unable to bear weight, without significant pain.  We placed her in a posterior splint, and put her on crutches and that we will have her follow-up with orthopedics.  We discussed return precautions she voiced understanding.  She has no hand pain on exam, as she attempted to brace herself, and she did not hit her head and is not on any blood thinners.  Pain medication sent to the pharmacy of her choice.  Risk Prescription drug management.   Final Clinical Impression(s) / ED Diagnoses Final diagnoses:  Closed nondisplaced fracture of lateral malleolus of left fibula, initial encounter  Closed avulsion fracture of medial malleolus of left tibia, initial encounter    Rx / DC Orders ED Discharge Orders          Ordered    oxyCODONE -acetaminophen  (PERCOCET/ROXICET) 5-325 MG tablet  Every 6 hours PRN        12/22/23 1852              Philippa Lyle CROME, [place] 12/22/23 1933    Yolande Lamar BROCKS, MD 12/24/23 1539

## 2023-12-22 NOTE — Discharge Instructions (Addendum)
 Please follow-up with your primary care doctor, and the orthopedic doctor as instructed.  You cannot get your splint wet, as it will cause swelling, and will need to be cut off.  If you have any loss of sensation to your foot, redness, swelling, or worsening pain please return to the ER.  You will be nonweightbearing, and you need to wear use your crutches, or a knee scooter at all times.

## 2023-12-24 ENCOUNTER — Encounter: Payer: Self-pay | Admitting: Orthopedic Surgery

## 2023-12-24 ENCOUNTER — Ambulatory Visit (INDEPENDENT_AMBULATORY_CARE_PROVIDER_SITE_OTHER): Payer: BC Managed Care – PPO | Admitting: Orthopedic Surgery

## 2023-12-24 DIAGNOSIS — S82892A Other fracture of left lower leg, initial encounter for closed fracture: Secondary | ICD-10-CM | POA: Diagnosis not present

## 2023-12-24 MED ORDER — OXYCODONE HCL 5 MG PO TABS: 5.0000 mg | ORAL_TABLET | ORAL | 0 refills | Status: AC | PRN

## 2023-12-24 NOTE — Patient Instructions (Addendum)
 Note for work - out of work for at least 2 months   Geneticist, molecular  1.  You were placed in a cast in clinic today.  Please keep the cast material clean, dry and intact.  Please do not use anything to itch the under the cast.  If it gets itchy, you can consider taking benadryl, or similar medication.  If the cast material gets wet, place it on a towel and use a hair dryer on a low setting. 2.  Tylenol  or Ibuprofen /Naproxen  as needed.   3.  Recommend elevating your extremity as much as possible to help with swelling. 4.  F/u 2 weeks, cast off and repeat XR

## 2023-12-26 ENCOUNTER — Encounter: Payer: Self-pay | Admitting: Orthopedic Surgery

## 2023-12-26 NOTE — Progress Notes (Signed)
New Patient Visit  Assessment: Stacy Holder is a 53 y.o. female with the following: 1. Closed fracture of left ankle, initial encounter  Plan: Stacy Holder twisted her ankle, and sustained a left ankle fracture.  Distal fibula fracture is with very little displacement.  On physical exam, she does have some medial swelling and ecchymosis.  Radiographs demonstrates a very small avulsion fracture.  This most likely represents a bimalleolar ankle fracture.  However, the ankle remains in appropriate alignment.  She would like to proceed without surgery.  I think it is reasonable.  She is aware that if it displaces further, we will have to consider operative fixation.  She was placed in a cast.  Elevate is much as possible.  Pain medicines provided.  Follow-up in 2 weeks.  Cast application - Left short leg cast   Verbal consent was obtained and the correct extremity was identified. A well padded, appropriately molded short leg cast was applied to the Left leg Fingers remained warm and well perfused.   There were no sharp edges Patient tolerated the procedure well Cast care instructions were provided    Follow-up: Return in about 2 weeks (around 01/07/2024).  Subjective:  Chief Complaint  Patient presents with   Ankle Injury    L ankle DOI 12/22/23    History of Present Illness: Stacy Holder is a 53 y.o. female who presents for evaluation of left ankle pain.  She twisted her ankle, going up stairs at home.  She slipped on ice.  She states that she fell directly onto the left ankle.  She presented to the emergency department, radiographs demonstrated a fracture.  She was placed in a splint.  She has remained nonweightbearing.  She continues to have a lot of pain.  She has been taking narcotics.  She has been elevating the foot is much as possible.   Review of Systems: No fevers or chills No numbness or tingling No chest pain No shortness of breath No bowel or bladder  dysfunction No GI distress No headaches   Medical History:  Past Medical History:  Diagnosis Date   BV (bacterial vaginosis) 11/24/2015   Kidney disease 12/2021   Rectal pressure 11/24/2015   Rectocele 11/24/2015   Rectocele 11/24/2015   Vaginal discharge 11/24/2015   Vaginal odor 11/24/2015    Past Surgical History:  Procedure Laterality Date   ABDOMINAL HYSTERECTOMY     Hammer Toe Repair Left 05/30/2017   Left #5 Toe   TUBAL LIGATION      Family History  Problem Relation Age of Onset   Hypertension Mother    Diabetes Mother    Kidney failure Father    Diabetes Father    Hypertension Sister    Breast cancer Sister    Asthma Daughter    Diabetes Maternal Grandmother    Alzheimer's disease Paternal Grandmother    Social History   Tobacco Use   Smoking status: Former    Current packs/day: 0.00    Average packs/day: 0.3 packs/day for 15.0 years (3.8 ttl pk-yrs)    Types: Cigarettes    Start date: 10/07/2004    Quit date: 10/08/2019    Years since quitting: 4.2   Smokeless tobacco: Never  Vaping Use   Vaping status: Never Used  Substance Use Topics   Alcohol use: Yes    Comment: occ   Drug use: No    Allergies  Allergen Reactions   Latex Other (See Comments)    blisters  Tape Rash    Cause a burn    Current Meds  Medication Sig   Boric Acid Vaginal 600 MG SUPP Place 600 mg vaginally once a week.   cetirizine (ZYRTEC) 10 MG tablet Take 1 tablet (10 mg total) by mouth daily.   Cholecalciferol (VITAMIN D3) 50 MCG (2000 UT) TABS Take 1 tablet by mouth every morning.   cyclobenzaprine (FLEXERIL) 10 MG tablet Take 10 mg by mouth 3 (three) times daily as needed.   estradiol (ESTRACE VAGINAL) 0.1 MG/GM vaginal cream Pea-sized amount nightly for two weeks then twice per week   fluticasone (FLONASE) 50 MCG/ACT nasal spray Place 2 sprays into both nostrils daily.   gabapentin (NEURONTIN) 100 MG capsule Take 100 mg by mouth 3 (three) times daily.    loperamide (IMODIUM) 2 MG capsule Take 1 capsule (2 mg total) by mouth 4 (four) times daily as needed for diarrhea or loose stools.   metroNIDAZOLE (FLAGYL) 500 MG tablet Take 1 tablet (500 mg total) by mouth 2 (two) times daily.   Multiple Vitamins-Minerals (EMERGEN-C IMMUNE PLUS) PACK Take 1 Package by mouth daily.   naloxone (NARCAN) nasal spray 4 mg/0.1 mL    nicotine (NICOTROL) 10 MG inhaler Inhale 1 Cartridge (1 continuous puffing total) into the lungs as needed for smoking cessation.   oxyCODONE (ROXICODONE) 5 MG immediate release tablet Take 1 tablet (5 mg total) by mouth every 4 (four) hours as needed for up to 7 days.   promethazine-dextromethorphan (PROMETHAZINE-DM) 6.25-15 MG/5ML syrup Take 5 mLs by mouth 4 (four) times daily as needed for cough.   silver sulfADIAZINE (SILVADENE) 1 % cream Apply 1 application topically daily.    Objective: There were no vitals taken for this visit.  Physical Exam:  General: Alert and oriented. and No acute distress. Gait: Unable to ambulate.  Evaluation of the left ankle demonstrates diffuse swelling.  She has bruising both medial and lateral.  Swelling to the dorsum of the foot.  Toes warm and well-perfused.  Sensation intact to the dorsum of the foot.  Active motion intact in the EHL/TA.  IMAGING: I personally reviewed images previously obtained from the ED  X-rays of the left ankle were previously obtained.  Nondisplaced fracture of the distal fibula.  Very small avulsion fracture of the tip of the medial malleolus.  There is no medial clear space widening.  No disruption of the syndesmosis   New Medications:  Meds ordered this encounter  Medications   oxyCODONE (ROXICODONE) 5 MG immediate release tablet    Sig: Take 1 tablet (5 mg total) by mouth every 4 (four) hours as needed for up to 7 days.    Dispense:  30 tablet    Refill:  0      [name], MD  12/26/2023 9:23 AM

## 2023-12-29 ENCOUNTER — Telehealth: Payer: Self-pay | Admitting: Orthopedic Surgery

## 2023-12-29 NOTE — Telephone Encounter (Signed)
[employer] forms received. To [employer]. 

## 2023-12-31 ENCOUNTER — Ambulatory Visit (INDEPENDENT_AMBULATORY_CARE_PROVIDER_SITE_OTHER): Payer: BC Managed Care – PPO | Admitting: Orthopedic Surgery

## 2023-12-31 ENCOUNTER — Other Ambulatory Visit: Payer: Self-pay | Admitting: Orthopedic Surgery

## 2023-12-31 ENCOUNTER — Other Ambulatory Visit (INDEPENDENT_AMBULATORY_CARE_PROVIDER_SITE_OTHER): Payer: Self-pay

## 2023-12-31 ENCOUNTER — Encounter: Payer: Self-pay | Admitting: Orthopedic Surgery

## 2023-12-31 DIAGNOSIS — S82892A Other fracture of left lower leg, initial encounter for closed fracture: Secondary | ICD-10-CM

## 2023-12-31 DIAGNOSIS — S82892D Other fracture of left lower leg, subsequent encounter for closed fracture with routine healing: Secondary | ICD-10-CM

## 2023-12-31 MED ORDER — IBUPROFEN 800 MG PO TABS: 800.0000 mg | ORAL_TABLET | Freq: Three times a day (TID) | ORAL | 0 refills | Status: DC | PRN

## 2023-12-31 NOTE — Patient Instructions (Addendum)
Please schedule her follow-up appointment for 2 weeks from today  General Cast Instructions  1.  You were placed in a cast in clinic today.  Please keep the cast material clean, dry and intact.  Please do not use anything to itch the under the cast.  If it gets itchy, you can consider taking benadryl, or similar medication.  If the cast material gets wet, place it on a towel and use a hair dryer on a low setting. 2.  Tylenol or Ibuprofen/Naproxen as needed.   3.  Recommend elevating your extremity as much as possible to help with swelling. 4.  F/u 2 weeks, cast off and repeat XR

## 2023-12-31 NOTE — Addendum Note (Signed)
Addended by: Thane Edu A on: 12/31/2023 11:11 AM   Modules accepted: Orders

## 2023-12-31 NOTE — Progress Notes (Addendum)
Return patient Visit  Assessment: Stacy Holder is a 53 y.o. female with the following: 1. Closed fracture of left ankle, subsequent encounter  Plan: Stacy Holder sustained a minimally displaced bimalleolar ankle fracture.  Repeat radiographs today demonstrates no change in alignment.  We will continue with nonoperative management.  Previous cast was loose, so she returned to early.  No change in treatment plan.  Repeat cast placed today.  Follow-up in 2 weeks.  Cast application - Left short leg cast   Verbal consent was obtained and the correct extremity was identified. A well padded, appropriately molded short leg cast was applied to the Left leg Fingers remained warm and well perfused.   There were no sharp edges Patient tolerated the procedure well Cast care instructions were provided    Follow-up: No follow-ups on file.  Subjective:  Chief Complaint  Patient presents with   Cast Problem    Loose DOI 12/22/23    History of Present Illness: Stacy Holder is a 53 y.o. female who returns for evaluation of left ankle pain.  She sustained a left ankle fracture, approximately 1 week ago.  She was placed in a cast, but this is gotten loose.  She feels much better.  Her pain is better.  She has been elevating her foot.  She has been keeping her weight off her left ankle.  She has been alternating Tylenol, ibuprofen and oxycodone.  Review of Systems: No fevers or chills No numbness or tingling No chest pain No shortness of breath No bowel or bladder dysfunction No GI distress No headaches   Objective: There were no vitals taken for this visit.  Physical Exam:  General: Alert and oriented. and No acute distress. Gait: Ambulating with the assistance of crutches.  Left ankle with diffuse swelling.  This is improved compared to prior visits.  She tolerates light touch, without the same tenderness.  Active motion intact in the EHL/TA.  Toes are warm and  well-perfused.  Sensation intact to the dorsum of the foot.  IMAGING: I personally ordered and reviewed the following images  X-rays of the left ankle were obtained in clinic today.  These are compared to prior x-rays.  There is been no change in overall alignment.  Nondisplaced fracture of the left distal fibula.  Small avulsion fracture off the tip of the medial malleolus, without medial clear space widening.  Mortise is congruent.  Impression: Left minimally displaced bimalleolar ankle fracture     New Medications:  No orders of the defined types were placed in this encounter.     [name], MD  12/31/2023 10:39 AM

## 2024-01-06 ENCOUNTER — Encounter: Payer: Self-pay | Admitting: Internal Medicine

## 2024-01-06 ENCOUNTER — Ambulatory Visit (INDEPENDENT_AMBULATORY_CARE_PROVIDER_SITE_OTHER): Payer: BC Managed Care – PPO | Admitting: Internal Medicine

## 2024-01-06 VITALS — BP 120/82 | HR 127 | Ht 63.5 in | Wt 174.2 lb

## 2024-01-06 DIAGNOSIS — Z0001 Encounter for general adult medical examination with abnormal findings: Secondary | ICD-10-CM | POA: Insufficient documentation

## 2024-01-06 DIAGNOSIS — Z720 Tobacco use: Secondary | ICD-10-CM

## 2024-01-06 DIAGNOSIS — F5000 Anorexia nervosa, unspecified: Secondary | ICD-10-CM | POA: Diagnosis not present

## 2024-01-06 DIAGNOSIS — R300 Dysuria: Secondary | ICD-10-CM | POA: Diagnosis not present

## 2024-01-06 NOTE — Assessment & Plan Note (Signed)
Weight stable.  Appetite has improved.  She is no longer taking dronabinol.

## 2024-01-06 NOTE — Assessment & Plan Note (Addendum)
Her acute concern today is dysuria x 6 days.  Denies foul odor/discoloration of urine, abdominal pain, flank pain, and fever/chills.  UA obtained today is not consistent with infection.  She attributes her symptoms to dehydration because symptoms intermittently resolved with increased water intake.  No further workup indicated.  No indication for antibiotic treatment.

## 2024-01-06 NOTE — Progress Notes (Signed)
Complete physical exam  Patient: Stacy Holder   DOB: 04-17-1971   53 y.o. Female  MRN: [id number]  Subjective:    Chief Complaint  Patient presents with   Annual Exam    Stacy Holder is a 53 y.o. female who presents today for a complete physical exam. She reports consuming a general diet. The patient does not participate in regular exercise at present. She generally feels fairly well. She reports sleeping well. She does have additional problems to discuss today. She endorses dysuria x 6 days. No abdominal pain, fever/chills, change in urine color  or odor, denies increased urinary frequency.    Most recent fall risk assessment:    01/06/2024    1:11 PM  Fall Risk   Falls in the past year? 1  Number falls in past yr: 0  Injury with Fall? 1  Risk for fall due to : Impaired balance/gait;Other (Comment)  Risk for fall due to: Comment ice  Follow up Education provided;Falls prevention discussed;Falls evaluation completed     Most recent depression screenings:    01/06/2024    1:12 PM 05/02/2023   10:22 AM  PHQ 2/9 Scores  PHQ - 2 Score 0 0  PHQ- 9 Score 4 4    Vision:Within last year and Dental: Current dental problems and Receives regular dental care (Recently broke a tooth)   Patient Active Problem List   Diagnosis Date Noted   Dysuria 01/06/2024   Encounter for well adult exam with abnormal findings 01/06/2024   Need for zoster vaccination 05/08/2023   Anorexia nervosa 01/24/2023   Degenerative disc disease, lumbar 01/24/2023   Depression, major, single episode, mild (HCC) 01/24/2023   H/O abdominal hysterectomy 01/24/2023   Current tobacco use 01/24/2023   Encounter for general adult medical examination with abnormal findings 01/24/2023   Chronic venous insufficiency 06/12/2021   Past Medical History:  Diagnosis Date   BV (bacterial vaginosis) 11/24/2015   Kidney disease 12/2021   Rectal pressure 11/24/2015   Rectocele 11/24/2015   Rectocele 11/24/2015    Vaginal discharge 11/24/2015   Vaginal odor 11/24/2015   Past Surgical History:  Procedure Laterality Date   ABDOMINAL HYSTERECTOMY     Hammer Toe Repair Left 05/30/2017   Left #5 Toe   TUBAL LIGATION     Social History   Tobacco Use   Smoking status: Former    Current packs/day: 0.00    Average packs/day: 0.3 packs/day for 15.0 years (3.8 ttl pk-yrs)    Types: Cigarettes    Start date: 10/07/2004    Quit date: 10/08/2019    Years since quitting: 4.2   Smokeless tobacco: Never  Vaping Use   Vaping status: Never Used  Substance Use Topics   Alcohol use: Yes    Comment: occ   Drug use: No   Family History  Problem Relation Age of Onset   Hypertension Mother    Diabetes Mother    Kidney failure Father    Diabetes Father    Hypertension Sister    Breast cancer Sister    Asthma Daughter    Diabetes Maternal Grandmother    Alzheimer's disease Paternal Grandmother    Allergies  Allergen Reactions   Latex Other (See Comments)    blisters   Tape Rash    Cause a burn    Patient Care Team: Billie Lade, MD as PCP - General (Internal Medicine)   Outpatient Medications Prior to Visit  Medication Sig   Boric Acid  Vaginal 600 MG SUPP Place 600 mg vaginally once a week.   cetirizine (ZYRTEC) 10 MG tablet Take 1 tablet (10 mg total) by mouth daily.   Cholecalciferol (VITAMIN D3) 50 MCG (2000 UT) TABS Take 1 tablet by mouth every morning.   cyclobenzaprine (FLEXERIL) 10 MG tablet Take 10 mg by mouth 3 (three) times daily as needed.   estradiol (ESTRACE VAGINAL) 0.1 MG/GM vaginal cream Pea-sized amount nightly for two weeks then twice per week   estradiol (ESTRACE) 1 MG tablet Take 1 tablet (1 mg total) by mouth daily.   fluticasone (FLONASE) 50 MCG/ACT nasal spray Place 2 sprays into both nostrils daily.   gabapentin (NEURONTIN) 100 MG capsule Take 100 mg by mouth 3 (three) times daily.   ibuprofen (ADVIL) 800 MG tablet Take 1 tablet (800 mg total) by mouth every 8  (eight) hours as needed.   loperamide (IMODIUM) 2 MG capsule Take 1 capsule (2 mg total) by mouth 4 (four) times daily as needed for diarrhea or loose stools.   metroNIDAZOLE (FLAGYL) 500 MG tablet Take 1 tablet (500 mg total) by mouth 2 (two) times daily.   Multiple Vitamins-Minerals (EMERGEN-C IMMUNE PLUS) PACK Take 1 Package by mouth daily.   naloxone (NARCAN) nasal spray 4 mg/0.1 mL    nicotine (NICOTROL) 10 MG inhaler Inhale 1 Cartridge (1 continuous puffing total) into the lungs as needed for smoking cessation.   promethazine-dextromethorphan (PROMETHAZINE-DM) 6.25-15 MG/5ML syrup Take 5 mLs by mouth 4 (four) times daily as needed for cough.   silver sulfADIAZINE (SILVADENE) 1 % cream Apply 1 application topically daily.   No facility-administered medications prior to visit.   Review of Systems  Genitourinary:  Positive for dysuria (6 days).      Objective:     BP 120/82   Pulse (!) 127   Ht 5' 3.5" (1.613 m)   Wt 174 lb 3.2 oz (79 kg)   SpO2 94%   BMI 30.37 kg/m  BP Readings from Last 3 Encounters:  01/06/24 120/82  12/22/23 128/80  05/02/23 (!) 134/94   Wt Readings from Last 3 Encounters:  01/06/24 174 lb 3.2 oz (79 kg)  12/22/23 149 lb 14.6 oz (68 kg)  05/02/23 151 lb (68.5 kg)    Physical Exam Constitutional:      General: She is not in acute distress.    Appearance: Normal appearance. She is not toxic-appearing.  HENT:     Head: Normocephalic and atraumatic.     Right Ear: External ear normal.     Left Ear: External ear normal.     Nose: Nose normal. No congestion or rhinorrhea.     Mouth/Throat:     Mouth: Mucous membranes are moist.     Pharynx: Oropharynx is clear. No oropharyngeal exudate or posterior oropharyngeal erythema.  Eyes:     General: No scleral icterus.    Extraocular Movements: Extraocular movements intact.     Conjunctiva/sclera: Conjunctivae normal.     Pupils: Pupils are equal, round, and reactive to light.  Cardiovascular:     Rate  and Rhythm: Normal rate and regular rhythm.     Pulses: Normal pulses.     Heart sounds: Normal heart sounds. No murmur heard.    No friction rub. No gallop.  Pulmonary:     Effort: Pulmonary effort is normal.     Breath sounds: Normal breath sounds. No wheezing, rhonchi or rales.  Abdominal:     General: Abdomen is flat. Bowel sounds are normal. There  is no distension.     Palpations: Abdomen is soft.     Tenderness: There is no abdominal tenderness.  Musculoskeletal:        General: Deformity (hard cast on left ankle/foot) present. No swelling.     Cervical back: Normal range of motion.     Right lower leg: No edema.     Left lower leg: No edema.  Lymphadenopathy:     Cervical: No cervical adenopathy.  Skin:    General: Skin is warm and dry.     Capillary Refill: Capillary refill takes less than 2 seconds.     Coloration: Skin is not jaundiced.  Neurological:     General: No focal deficit present.     Mental Status: She is alert and oriented to person, place, and time.  Psychiatric:        Mood and Affect: Mood normal.        Behavior: Behavior normal.   Last CBC Lab Results  Component Value Date   WBC 10.2 04/27/2019   HGB 14.3 04/27/2019   HCT 43.6 04/27/2019   MCV 93.2 04/27/2019   MCH 30.6 04/27/2019   RDW 12.5 04/27/2019   PLT 291 04/27/2019   Last metabolic panel Lab Results  Component Value Date   GLUCOSE 90 04/27/2019   NA 139 04/27/2019   K 4.1 04/27/2019   CL 101 04/27/2019   CO2 26 04/27/2019   BUN 12 04/27/2019   CREATININE 1.07 (H) 04/27/2019   GFRNONAA >60 04/27/2019   CALCIUM 9.3 04/27/2019   PROT 6.6 10/23/2018   ALBUMIN 4.5 10/23/2018   LABGLOB 2.1 10/23/2018   AGRATIO 2.1 10/23/2018   BILITOT 0.4 10/23/2018   ALKPHOS 63 10/23/2018   AST 15 10/23/2018   ALT 13 10/23/2018   ANIONGAP 12 04/27/2019   Last lipids Lab Results  Component Value Date   CHOL 190 10/23/2018   HDL 89 10/23/2018   LDLCALC 88 10/23/2018   TRIG 63 10/23/2018    CHOLHDL 2.1 10/23/2018   Last hemoglobin A1c Lab Results  Component Value Date   HGBA1C 5.1 10/23/2018   Last thyroid functions Lab Results  Component Value Date   TSH 0.972 10/23/2018   Last vitamin D Lab Results  Component Value Date   VD25OH 10.6 (L) 10/23/2018      Assessment & Plan:    Routine Health Maintenance and Physical Exam  Immunization History  Administered Date(s) Administered   Influenza,inj,Quad PF,6+ Mos 01/24/2023   Tdap 02/17/2017   Zoster Recombinant(Shingrix) 05/02/2023, 07/22/2023    Health Maintenance  Topic Date Due   INFLUENZA VACCINE  07/10/2023   COVID-19 Vaccine (1 - 2024-25 season) Never done   MAMMOGRAM  04/02/2024   DTaP/Tdap/Td (2 - Td or Tdap) 02/18/2027   Colonoscopy  10/07/2031   Hepatitis C Screening  Completed   HIV Screening  Completed   Zoster Vaccines- Shingrix  Completed   HPV VACCINES  Aged Out    Discussed health benefits of physical activity, and encouraged her to engage in regular exercise appropriate for her age and condition.  Problem List Items Addressed This Visit       Anorexia nervosa   Weight stable.  Appetite has improved.  She is no longer taking dronabinol.      Current tobacco use   She reports that she previously quit smoking cigars using a Nicotrol inhaler, but recently resumed smoking in the setting of left ankle fracture.  She currently smokes 2 black and mild cigars  daily but plans to quit again and has set a quit date of February 1. -The patient was counseled on the dangers of tobacco use, and was advised to quit.  Reviewed strategies to maximize success, including removing cigarettes and smoking materials from environment, stress management, substitution of other forms of reinforcement, support of family/friends, and written materials.       Dysuria - Primary   Her acute concern today is dysuria x 6 days.  Denies foul odor/discoloration of urine, abdominal pain, flank pain, and fever/chills.  UA  obtained today is not consistent with infection.  She attributes her symptoms to dehydration because symptoms intermittently resolved with increased water intake.  No further workup indicated.  No indication for antibiotic treatment.      Encounter for well adult exam with abnormal findings   Presenting today for annual physical.  Previous records and labs reviewed. -Repeat labs ordered in December to be completed today -Preventative items are largely up-to-date -We will tentatively plan for follow-up in 6 months      Return in about 6 months (around 07/05/2024).  Billie Lade, MD

## 2024-01-06 NOTE — Patient Instructions (Signed)
It was a pleasure to see you today.  Thank you for giving [place] the opportunity to be involved in your care.  Below is a brief recap of your visit and next steps.  We will plan to see you again in 6 months.  Summary Annual physical completed today Repeat labs ordered We will follow up in 6 months

## 2024-01-06 NOTE — Assessment & Plan Note (Signed)
Presenting today for annual physical.  Previous records and labs reviewed. -Repeat labs ordered in December to be completed today -Preventative items are largely up-to-date -We will tentatively plan for follow-up in 6 months

## 2024-01-06 NOTE — Assessment & Plan Note (Signed)
She reports that she previously quit smoking cigars using a Nicotrol inhaler, but recently resumed smoking in the setting of left ankle fracture.  She currently smokes 2 black and mild cigars daily but plans to quit again and has set a quit date of February 1. -The patient was counseled on the dangers of tobacco use, and was advised to quit.  Reviewed strategies to maximize success, including removing cigarettes and smoking materials from environment, stress management, substitution of other forms of reinforcement, support of family/friends, and written materials.

## 2024-01-07 ENCOUNTER — Encounter: Payer: BC Managed Care – PPO | Admitting: Orthopedic Surgery

## 2024-01-07 ENCOUNTER — Encounter: Payer: Self-pay | Admitting: Internal Medicine

## 2024-01-07 LAB — CMP14+EGFR
ALT: 20 [IU]/L (ref 0–32)
AST: 22 [IU]/L (ref 0–40)
Albumin: 4.1 g/dL (ref 3.8–4.9)
Alkaline Phosphatase: 82 [IU]/L (ref 44–121)
BUN/Creatinine Ratio: 13 (ref 9–23)
BUN: 12 mg/dL (ref 6–24)
Bilirubin Total: 0.2 mg/dL (ref 0.0–1.2)
CO2: 22 mmol/L (ref 20–29)
Calcium: 9.9 mg/dL (ref 8.7–10.2)
Chloride: 105 mmol/L (ref 96–106)
Creatinine, Ser: 0.95 mg/dL (ref 0.57–1.00)
Globulin, Total: 2.5 g/dL (ref 1.5–4.5)
Glucose: 127 mg/dL — ABNORMAL HIGH (ref 70–99)
Potassium: 4.3 mmol/L (ref 3.5–5.2)
Sodium: 143 mmol/L (ref 134–144)
Total Protein: 6.6 g/dL (ref 6.0–8.5)
eGFR: 72 mL/min/{1.73_m2} (ref >59)

## 2024-01-07 LAB — URINALYSIS
Bilirubin, UA: NEGATIVE
Glucose, UA: NEGATIVE
Ketones, UA: NEGATIVE
Leukocytes,UA: NEGATIVE
Nitrite, UA: NEGATIVE
Protein,UA: NEGATIVE
RBC, UA: NEGATIVE
Specific Gravity, UA: 1.02 (ref 1.005–1.030)
Urobilinogen, Ur: 0.2 mg/dL (ref 0.2–1.0)
pH, UA: 6 (ref 5.0–7.5)

## 2024-01-07 LAB — CBC WITH DIFFERENTIAL/PLATELET
Basophils Absolute: 0 10*3/uL (ref 0.0–0.2)
Basos: 0 %
EOS (ABSOLUTE): 0.2 10*3/uL (ref 0.0–0.4)
Eos: 1 %
Hematocrit: 41 % (ref 34.0–46.6)
Hemoglobin: 13.5 g/dL (ref 11.1–15.9)
Immature Grans (Abs): 0 10*3/uL (ref 0.0–0.1)
Immature Granulocytes: 0 %
Lymphocytes Absolute: 3.4 10*3/uL — ABNORMAL HIGH (ref 0.7–3.1)
Lymphs: 32 %
MCH: 31.5 pg (ref 26.6–33.0)
MCHC: 32.9 g/dL (ref 31.5–35.7)
MCV: 96 fL (ref 79–97)
Monocytes Absolute: 0.8 10*3/uL (ref 0.1–0.9)
Monocytes: 8 %
Neutrophils Absolute: 6.2 10*3/uL (ref 1.4–7.0)
Neutrophils: 59 %
Platelets: 319 10*3/uL (ref 150–450)
RBC: 4.29 x10E6/uL (ref 3.77–5.28)
RDW: 11.6 % — ABNORMAL LOW (ref 11.7–15.4)
WBC: 10.6 10*3/uL (ref 3.4–10.8)

## 2024-01-07 LAB — LIPID PANEL
Chol/HDL Ratio: 2.6 {ratio} (ref 0.0–4.4)
Cholesterol, Total: 189 mg/dL (ref 100–199)
HDL: 72 mg/dL (ref >39)
LDL Chol Calc (NIH): 94 mg/dL (ref 0–99)
Triglycerides: 133 mg/dL (ref 0–149)
VLDL Cholesterol Cal: 23 mg/dL (ref 5–40)

## 2024-01-07 LAB — IRON,TIBC AND FERRITIN PANEL
Ferritin: 132 ng/mL (ref 15–150)
Iron Saturation: 33 % (ref 15–55)
Iron: 89 ug/dL (ref 27–159)
Total Iron Binding Capacity: 273 ug/dL (ref 250–450)
UIBC: 184 ug/dL (ref 131–425)

## 2024-01-07 LAB — TSH+FREE T4
Free T4: 1.32 ng/dL (ref 0.82–1.77)
TSH: 2.08 u[IU]/mL (ref 0.450–4.500)

## 2024-01-07 LAB — HEMOGLOBIN A1C
Est. average glucose Bld gHb Est-mCnc: 100 mg/dL
Hgb A1c MFr Bld: 5.1 % (ref 4.8–5.6)

## 2024-01-07 LAB — B12 AND FOLATE PANEL
Folate: 6.8 ng/mL (ref >3.0)
Vitamin B-12: 473 pg/mL (ref 232–1245)

## 2024-01-07 LAB — VITAMIN D 25 HYDROXY (VIT D DEFICIENCY, FRACTURES): Vit D, 25-Hydroxy: 33.7 ng/mL (ref 30.0–100.0)

## 2024-01-14 ENCOUNTER — Encounter: Payer: Self-pay | Admitting: Orthopedic Surgery

## 2024-01-14 ENCOUNTER — Other Ambulatory Visit (INDEPENDENT_AMBULATORY_CARE_PROVIDER_SITE_OTHER): Payer: Self-pay

## 2024-01-14 ENCOUNTER — Ambulatory Visit (INDEPENDENT_AMBULATORY_CARE_PROVIDER_SITE_OTHER): Payer: BC Managed Care – PPO | Admitting: Orthopedic Surgery

## 2024-01-14 DIAGNOSIS — S82892D Other fracture of left lower leg, subsequent encounter for closed fracture with routine healing: Secondary | ICD-10-CM | POA: Diagnosis not present

## 2024-01-14 MED ORDER — OXYCODONE HCL 5 MG PO TABS: 5.0000 mg | ORAL_TABLET | Freq: Three times a day (TID) | ORAL | 0 refills | Status: AC | PRN

## 2024-01-14 NOTE — Patient Instructions (Signed)

## 2024-01-14 NOTE — Progress Notes (Signed)
 Return patient Visit  Assessment: Stacy Holder is a 53 y.o. female with the following: 1. Closed fracture of left ankle, subsequent encounter  Plan: Jerel JONETTA Mt sustained a minimally displaced bimalleolar ankle fracture.  Radiographs remained stable.  Injury was approximately 3+ weeks ago.  Will continue with another cast.  Plan to transition to a walking boot at the next visit.  Refill of oxycodone  has been provided.   Cast application - Left short leg cast   Verbal consent was obtained and the correct extremity was identified. A well padded, appropriately molded short leg cast was applied to the Left leg Fingers remained warm and well perfused.   There were no sharp edges Patient tolerated the procedure well Cast care instructions were provided    Follow-up: Return in about 2 weeks (around 01/28/2024).  Subjective:  Chief Complaint  Patient presents with   Fracture    L ankle  DOI 12/22/23    History of Present Illness: Stacy Holder is a 53 y.o. female who returns for evaluation of left ankle pain.  She sustained a left ankle fracture, approximately 3.5 weeks ago.  She has been immobilized in a cast for 3 weeks.  She is feeling much better.  Still takes oxycodone  occasionally.  She has been alternating Tylenol  and oxycodone .  No numbness or tingling.   Review of Systems: No fevers or chills No numbness or tingling No chest pain No shortness of breath No bowel or bladder dysfunction No GI distress No headaches   Objective: There were no vitals taken for this visit.  Physical Exam:  General: Alert and oriented. and No acute distress. Gait: Ambulating with the assistance of crutches.  Left ankle with minimal swelling.  Tenderness to palpation over the distal fibula.  Sensation intact over the dorsum of the foot.  She is able to get to a plantigrade position.  Toes are warm and well-perfused.  IMAGING: I personally ordered and reviewed the following  images  X-rays of the left ankle were obtained in clinic today.  These are compared available x-rays.  Overall alignment remains unchanged.  Distal fibula fracture is in near-anatomic alignment.  Mortise is congruent.  Small avulsion fracture off the tip of the medial malleolus.  Syndesmosis is intact.  No bony lesions.  No interval displacement.  Impression: Stable left bimalleolar ankle fracture    New Medications:  Meds ordered this encounter  Medications   oxyCODONE  (ROXICODONE ) 5 MG immediate release tablet    Sig: Take 1 tablet (5 mg total) by mouth every 8 (eight) hours as needed for up to 7 days.    Dispense:  20 tablet    Refill:  0      Oneil DELENA Horde, MD  01/14/2024 10:30 AM

## 2024-01-28 ENCOUNTER — Other Ambulatory Visit (INDEPENDENT_AMBULATORY_CARE_PROVIDER_SITE_OTHER): Payer: Self-pay

## 2024-01-28 ENCOUNTER — Ambulatory Visit (INDEPENDENT_AMBULATORY_CARE_PROVIDER_SITE_OTHER): Payer: BC Managed Care – PPO | Admitting: Orthopedic Surgery

## 2024-01-28 ENCOUNTER — Encounter: Payer: Self-pay | Admitting: Orthopedic Surgery

## 2024-01-28 DIAGNOSIS — S82892D Other fracture of left lower leg, subsequent encounter for closed fracture with routine healing: Secondary | ICD-10-CM | POA: Diagnosis not present

## 2024-01-28 NOTE — Patient Instructions (Signed)
Wear the boot on your left ankle at all times for the next 2 weeks.  No weightbearing.  Okay to remove the boot for hygiene, and gentle range of motion exercises.  In 2 weeks, you can start to bear weight while wearing the boot.  Okay to transition to weightbearing as tolerated.  Follow-up in clinic in 1 month.

## 2024-01-28 NOTE — Progress Notes (Signed)
Return patient Visit  Assessment: Stacy Holder is a 53 y.o. female with the following: 1. Closed fracture of left ankle, subsequent encounter  Plan: Stacy Holder sustained a minimally displaced bimalleolar ankle fracture.  She is doing well.  She has no pain.  No tenderness to palpation.  Radiographs are stable.  Injury was a little over a month ago.  At this time, we will transition her to a walking boot.  She is to remain nonweightbearing for the next 2 weeks, wearing the boot at all times.  In 2 weeks, she can start to transition to weightbearing as tolerated.  Okay to remove the boot for hygiene and gentle range of motion.  I would like see her back in 1 month.    Follow-up: Return in about 4 weeks (around 02/25/2024).  Subjective:  Chief Complaint  Patient presents with   Fracture    L ankle  DOI 12/22/23    History of Present Illness: Stacy Holder is a 52 y.o. female who returns for evaluation of left ankle pain.  She sustained a left ankle fracture, approximately 4-5 weeks ago.  She has been in a cast for about 1 month.  She denies pain.  She is not taking medications.  She continues to use a knee rollator.  She has been elevating her leg.  She has remained nonweightbearing.  Review of Systems: No fevers or chills No numbness or tingling No chest pain No shortness of breath No bowel or bladder dysfunction No GI distress No headaches   Objective: There were no vitals taken for this visit.  Physical Exam:  General: Alert and oriented. and No acute distress. Gait: Ambulating with the assistance of a knee rollator.  Left ankle without swelling.  She has dry skin.  No tenderness palpation over the medial malleolus.  No tenderness to palpation over the distal fibula.  Sensation intact to the dorsum of the foot.  She tolerates gentle dorsiflexion of the ankle and the great toe.  Toes are warm and well-perfused.  IMAGING: I personally ordered and reviewed the  following images  X-rays left ankle were obtained in clinic today.  These are compared to prior x-rays.  These are nonweightbearing views.  Distal fibula remains in stable alignment.  No interval displacement.  The mortise is congruent.  There is no disruption of the syndesmosis.  Avulsion fracture of the tip of the medial malleolus remains in unchanged alignment.  Impression: Stable left ankle fracture without interval displacement    New Medications:  No orders of the defined types were placed in this encounter.     [name], MD  01/28/2024 10:49 AM

## 2024-02-11 ENCOUNTER — Encounter: Payer: Self-pay | Admitting: Orthopedic Surgery

## 2024-02-25 ENCOUNTER — Other Ambulatory Visit (INDEPENDENT_AMBULATORY_CARE_PROVIDER_SITE_OTHER): Payer: Self-pay

## 2024-02-25 ENCOUNTER — Encounter: Payer: Self-pay | Admitting: Orthopedic Surgery

## 2024-02-25 ENCOUNTER — Ambulatory Visit (INDEPENDENT_AMBULATORY_CARE_PROVIDER_SITE_OTHER): Payer: BC Managed Care – PPO | Admitting: Orthopedic Surgery

## 2024-02-25 VITALS — BP 136/91 | HR 87

## 2024-02-25 DIAGNOSIS — S82892D Other fracture of left lower leg, subsequent encounter for closed fracture with routine healing: Secondary | ICD-10-CM

## 2024-02-25 NOTE — Patient Instructions (Signed)
 Ok to transition to a regular shoe  Work on exercises for the left ankle  Note for work - continue with light duty for the next 4 weeks.  Ok to return to full duty 03/29/24.  Contact the clinic with questions or concerns.

## 2024-02-26 NOTE — Progress Notes (Signed)
 Return patient Visit  Assessment: Stacy Holder is a 53 y.o. female with the following: 1. Closed fracture of left ankle, subsequent encounter  Plan: [name] sustained a minimally displaced bimalleolar ankle fracture.  Radiographs stable.  Okay to transition out of the walking boot.  Continue with light duty at work.  Plan to return to full duty in about a month.  Would like to see her back in 6 weeks.  Medication as needed.   Follow-up: Return in about 6 weeks (around 04/07/2024).  Subjective:  Chief Complaint  Patient presents with   Routine Post Op    Left ankle DOI 12/22/23- wearing boot-ankle is tender on left side     History of Present Illness: Stacy Holder is a 53 y.o. female who returns for evaluation of left ankle pain.  She sustained a left ankle fracture, approximately 2 months ago.  She has been immobilized in the cast, and has been wearing a boot for the last couple of weeks.  She has been taking some steps out of the boot, but limited weightbearing.  Her pain is much better.  She has returned to light duty at work.   Review of Systems: No fevers or chills No numbness or tingling No chest pain No shortness of breath No bowel or bladder dysfunction No GI distress No headaches   Objective: BP (!) 136/91   Pulse 87   Physical Exam:  General: Alert and oriented. and No acute distress. Gait: Ambulating with the assistance of a knee rollator.  Left leg without swelling.  She tolerates gentle range of motion.  Toes warm well-perfused.  Sensation intact to the dorsum of the foot.  Mild tenderness palpation of the lateral ankle. IMAGING: I personally ordered and reviewed the following images  X-rays of the left ankle were obtained in clinic today.  These are compared to available x-rays.  Distal fibula in stable alignment.  No interval displacement.  Mortise is congruent.  No syndesmotic disruption.  Small avulsion fracture off the tip of the  medial malleolus.  No increased medial clear space widening.  Impression: Healed left bimalleolar ankle fracture    New Medications:  No orders of the defined types were placed in this encounter.     [name], MD  02/26/2024 10:11 PM

## 2024-04-07 ENCOUNTER — Ambulatory Visit (INDEPENDENT_AMBULATORY_CARE_PROVIDER_SITE_OTHER): Admitting: Orthopedic Surgery

## 2024-04-07 ENCOUNTER — Other Ambulatory Visit (INDEPENDENT_AMBULATORY_CARE_PROVIDER_SITE_OTHER): Payer: Self-pay

## 2024-04-07 ENCOUNTER — Encounter: Payer: Self-pay | Admitting: Orthopedic Surgery

## 2024-04-07 VITALS — BP 163/102 | HR 67

## 2024-04-07 DIAGNOSIS — S82892D Other fracture of left lower leg, subsequent encounter for closed fracture with routine healing: Secondary | ICD-10-CM | POA: Diagnosis not present

## 2024-04-07 MED ORDER — IBUPROFEN 800 MG PO TABS: 800.0000 mg | ORAL_TABLET | Freq: Three times a day (TID) | ORAL | 0 refills | Status: AC | PRN

## 2024-04-07 NOTE — Progress Notes (Signed)
 Return patient Visit  Assessment: Stacy Holder is a 53 y.o. female with the following: 1. Closed fracture of left ankle, subsequent encounter  Plan: Stacy Holder sustained a minimally displaced bimalleolar ankle fracture.  Radiographs stable.  She is back to work.  She is using a regular shoe.  She does have some stiffness in the Achilles.  I provided her with some exercises.  Continue using compression stocking.  I will provide a refill of ibuprofen .  For the numbness and tingling in the right arm, she can consider increasing her dose of gabapentin.  She will follow-up as needed.  Follow-up: Return if symptoms worsen or fail to improve.  Subjective:  Chief Complaint  Patient presents with   Ankle Injury    Left- DOI 12/22/23 -still swelling some bothers me at work after a few hours pain on lateral side. Ibuprofen  and tylenol  help   Arm Pain    Right- having numbness and bothers me at night. Taking gabapentin 3 times a day no relief    History of Present Illness: Stacy Holder is a 53 y.o. female who returns for evaluation of left ankle pain.  She sustained a left ankle fracture, almost 4 months ago.  She has transition to a regular shoe.  She has returned to work.  She notes some stiffness, and ongoing swelling.  She continues take ibuprofen .  She is doing some exercises for her left ankle.  Overall, she is doing well.  More recently, she started to have some numbness and tingling in the right upper extremity.  She has had this before.  She takes gabapentin, 100 mg 3 times a day.  This is not helping currently.  Review of Systems: No fevers or chills No numbness or tingling No chest pain No shortness of breath No bowel or bladder dysfunction No GI distress No headaches   Objective: BP (!) 163/102   Pulse 67   Physical Exam:  General: Alert and oriented. and No acute distress. Gait: Mildly antalgic left-sided gait.  Left ankle with mild swelling.  No  tenderness to palpation.  She is lacking some dorsiflexion.  No pain with inversion or eversion.  Sensation is intact throughout the left foot.  IMAGING: I personally ordered and reviewed the following images  X-rays left ankle were obtained in clinic today.  These are compared to prior x-rays.  Distal fibula fracture remains in stable alignment.  Fracture is healed.  There is no medial clear space widening.  Mortise is congruent.  No new injuries.  No bony lesions.   Impression: Healed left bimalleolar ankle fracture    New Medications:  No orders of the defined types were placed in this encounter.     Tonita Frater, MD  04/07/2024 9:27 AM

## 2024-04-07 NOTE — Patient Instructions (Signed)
[facility]  Ask your health care provider which exercises are safe for you. Do exercises exactly as told by your health care provider and adjust them as directed. It is normal to feel mild stretching, pulling, tightness, or discomfort as you do these exercises. Stop right away if you feel sudden pain or your pain gets worse. Do not begin these exercises until told by your health care provider.  Stretching and range-of-motion exercises These exercises warm up your muscles and joints and improve the movement and flexibility of your ankle. These exercises also help to relieve pain.  Standing wall calf stretch with straight knee    Stand with your hands against a wall. Extend your left / right leg behind you, and bend your front knee slightly. Keep both of your heels on the floor. Point the toes of your back foot slightly inward. Keeping your heels on the floor and your back knee straight, shift your weight toward the wall. Do not allow your back to arch. You should feel a gentle stretch in your upper calf. Hold this position for 10 seconds. Repeat 10 times. Complete this exercise 3-4 times per week.  Standing wall calf stretch with bent knee Stand with your hands against a wall. Extend your left / right leg behind you, and bend your front knee slightly. Keep both of your heels on the floor. Point the toes of your back foot slightly inward. Keeping your heels on the floor, bend your back knee slightly. You should feel a gentle stretch deep in your lower calf near your heel. Hold this position for 10 seconds. Repeat 10 times. Complete this exercise 3-4 times per week.  Strengthening exercises These exercises build strength and control of your ankle. Endurance is the ability to use your muscles for a long time, even after they get tired.  Plantar flexion with band    In this exercise, you push your toes downward, away from you, with an exercise band providing  resistance. Sit on the floor with your left / right leg extended. You may put a pillow under your calf to give your foot more room to move. Loop a rubber exercise band or tube around the ball of your left / right foot. The ball of your foot is on the walking surface, right under your toes. The band or tube should be slightly tense when your foot is relaxed. If the band or tube slips, you can put on your shoe or put a washcloth between the band and your foot to help it stay in place. Slowly point your toes downward, pushing them away from you (plantar flexion). Hold this position for 10 seconds. Slowly release the tension in the band or tube, controlling smoothly until your foot is back to the starting position. Repeat steps 1-5 with your left / right leg. Repeat 10 times. Complete this exercise 3-4 times per week.   Eccentric heel drop    In this exercise, you stand and slowly raise your heel and then slowly lower it. This exercise lengthens the calf muscles (eccentric) while the heel bears weight. If this exercise is too easy, try doing it while wearing a backpack with weights in it. Stand on a step with the balls of your feet. The ball of your foot is on the walking surface, right under your toes. Do not put your heels on the step. For balance, rest your hands on the wall or on a railing. Rise up onto the balls of your feet.  Keeping your heels up, shift all of your weight to your left / right leg and pick up your other leg. Slowly lower your left / right leg so your heel drops below the level of the step. Put down your other foot before returning to the start position. If told by your health care provider, build up to: 3 sets of 15 repetitions while keeping your knees straight. 3 sets of 15 repetitions while keeping your knees slightly bent as far as told by your health care provider. Repeat 10 times. Complete this exercise 3-4 times per week.   Balance exercises These exercises  improve or maintain your balance. Balance is important in preventing falls.   Single leg stand If this exercise is too easy, you can try it with your eyes closed or while standing on a pillow. Without shoes, stand near a railing or in a door frame. Hold on to the railing or door frame as needed. Stand on your left / right foot. Keep your big toe down on the floor and try to keep your arch lifted. Hold this position for 10 seconds. Repeat 10 times. Complete this exercise 3-4 times per week.   This information is not intended to replace advice given to you by your health care provider. Make sure you discuss any questions you have with your health care provider.

## 2024-04-12 ENCOUNTER — Ambulatory Visit: Payer: Self-pay | Admitting: Internal Medicine

## 2024-04-12 NOTE — Telephone Encounter (Signed)
 Chief Complaint: Right arm intermittent numbness (from shoulder to fingertips) x3 weeks worsening  Symptoms: Difficulty sleeping, sometimes feels like needles in tips of fingers Frequency: Intermittent Pertinent Negatives: Patient denies chest pain, difficulty breathing, palpitations, headache, dizziness, vision loss, double vision, changes in speech  Disposition:  [x] Appointment(In office/)  Additional Notes: Patient has ongoing intermittent numbness. Pt scheduled for an appointment tomorrow. This RN educated pt on new-worsening symptoms and when to call back/seek emergent care. Pt verbalized understanding and agrees to plan.    Copied from CRM [id number]. Topic: Clinical - Red Word Triage >> Apr 12, 2024 10:08 AM Felizardo Hotter wrote: Red Word that prompted transfer to Nurse Triage: Pt called stated has numbness in left arm. Reason for Disposition  [1] Weakness of arm / hand, or leg / foot AND [2] is a chronic symptom (recurrent or ongoing AND present > 4 weeks)  Answer Assessment - Initial Assessment Questions SYMPTOM: "What is the main symptom you are concerned about?" (e.g., weakness, numbness)     Intermittent numbness on right arm ONSET: "When did this start?" (minutes, hours, days; while sleeping)     Long-term issue but worsening in 3 weeks PATTERN "Does this come and go, or has it been constant since it started?"  "Is it present now?"     Comes and goes CARDIAC SYMPTOMS: "Have you had any of the following symptoms: chest pain, difficulty breathing, palpitations?"     Denies NEUROLOGIC SYMPTOMS: "Have you had any of the following symptoms: headache, dizziness, vision loss, double vision, changes in speech, unsteady on your feet?"     Denies  Protocols used: Neurologic Deficit-A-AH

## 2024-04-12 NOTE — Telephone Encounter (Signed)
Noted appointment scheduled.

## 2024-04-13 ENCOUNTER — Other Ambulatory Visit (HOSPITAL_COMMUNITY): Payer: Self-pay | Admitting: Internal Medicine

## 2024-04-13 ENCOUNTER — Ambulatory Visit: Payer: Self-pay | Admitting: Internal Medicine

## 2024-04-13 VITALS — BP 134/83 | HR 87 | Wt 172.4 lb

## 2024-04-13 DIAGNOSIS — Z1231 Encounter for screening mammogram for malignant neoplasm of breast: Secondary | ICD-10-CM

## 2024-04-13 DIAGNOSIS — M5030 Other cervical disc degeneration, unspecified cervical region: Secondary | ICD-10-CM

## 2024-04-13 DIAGNOSIS — S46811A Strain of other muscles, fascia and tendons at shoulder and upper arm level, right arm, initial encounter: Secondary | ICD-10-CM | POA: Diagnosis not present

## 2024-04-13 MED ORDER — PREDNISONE 20 MG PO TABS: 40.0000 mg | ORAL_TABLET | Freq: Every day | ORAL | 0 refills | Status: AC

## 2024-04-13 NOTE — Progress Notes (Signed)
 Acute Office Visit  Subjective:    Patient ID: Stacy Holder, female    DOB: Apr 26, 1971, 53 y.o.   MRN: [id number]  Chief Complaint  Patient presents with   Arm Pain    Pt reports sx of right arm pain and numbness.    HPI Patient is in today for complaint of neck pain, right arm pain and numbness of the right hand, for the last 3 weeks.  Pain is constant, dull, radiating towards the right arm and worse with neck extension and lateral flexion.  Denies any recent injury.  She reports that she went to play darts a day before her symptoms started.  She has history of cervical DDD, and used to take opioid medications for neck and back pain in the past.  She used to have bilateral UE numbness, but it had resolved for many years.  She currently takes gabapentin 200 mg 3 times daily, its dose was recently increased from 100 mg 3 times daily.  Past Medical History:  Diagnosis Date   BV (bacterial vaginosis) 11/24/2015   Kidney disease 12/2021   Rectal pressure 11/24/2015   Rectocele 11/24/2015   Rectocele 11/24/2015   Vaginal discharge 11/24/2015   Vaginal odor 11/24/2015    Past Surgical History:  Procedure Laterality Date   ABDOMINAL HYSTERECTOMY     Hammer Toe Repair Left 05/30/2017   Left #5 Toe   TUBAL LIGATION      Family History  Problem Relation Age of Onset   Hypertension Mother    Diabetes Mother    Kidney failure Father    Diabetes Father    Hypertension Sister    Breast cancer Sister    Asthma Daughter    Diabetes Maternal Grandmother    Alzheimer's disease Paternal Grandmother     Social History   Socioeconomic History   Marital status: Single    Spouse name: Not on file   Number of children: 2   Years of education: Not on file   Highest education level: Associate degree: occupational, Scientist, product/process development, or vocational program  Occupational History   Not on file  Tobacco Use   Smoking status: Former    Current packs/day: 0.00    Average packs/day: 0.3  packs/day for 15.0 years (3.8 ttl pk-yrs)    Types: Cigarettes    Start date: 10/07/2004    Quit date: 10/08/2019    Years since quitting: 4.5   Smokeless tobacco: Never  Vaping Use   Vaping status: Never Used  Substance and Sexual Activity   Alcohol use: Yes    Comment: occ   Drug use: No   Sexual activity: Yes    Birth control/protection: Surgical    Comment: hyst  Other Topics Concern   Not on file  Social History Narrative   Not on file   Social Drivers of Health   Financial Resource Strain: Low Risk  (04/13/2024)   Overall Financial Resource Strain (CARDIA)    Difficulty of Paying Living Expenses: Not hard at all  Food Insecurity: No Food Insecurity (04/13/2024)   Hunger Vital Sign    Worried About Running Out of Food in the Last Year: Never true    Ran Out of Food in the Last Year: Never true  Transportation Needs: No Transportation Needs (04/13/2024)   PRAPARE - Administrator, Civil Service (Medical): No    Lack of Transportation (Non-Medical): No  Physical Activity: Unknown (04/13/2024)   Exercise Vital Sign    Days of  Exercise per Week: 0 days    Minutes of Exercise per Session: Not on file  Stress: No Stress Concern Present (04/13/2024)   [employer] of Occupational Health - Occupational Stress Questionnaire    Feeling of Stress : Not at all  Social Connections: Moderately Integrated (04/13/2024)   Social Connection and Isolation Panel [NHANES]    Frequency of Communication with Friends and Family: More than three times a week    Frequency of Social Gatherings with Friends and Family: Once a week    Attends Religious Services: More than 4 times per year    Active Member of [employer] or Organizations: Yes    Attends Engineer, structural: More than 4 times per year    Marital Status: Never married  Intimate Partner Violence: Not on file    Outpatient Medications Prior to Visit  Medication Sig Dispense Refill   cetirizine  (ZYRTEC ) 10 MG tablet  Take 1 tablet (10 mg total) by mouth daily. 30 tablet 0   Cholecalciferol (VITAMIN D3) 50 MCG (2000 UT) TABS Take 1 tablet by mouth every morning.     cyclobenzaprine (FLEXERIL) 10 MG tablet Take 10 mg by mouth 3 (three) times daily as needed.     estradiol  (ESTRACE  VAGINAL) 0.1 MG/GM vaginal cream Pea-sized amount nightly for two weeks then twice per week 127.5 g 6   fluticasone  (FLONASE ) 50 MCG/ACT nasal spray Place 2 sprays into both nostrils daily. 16 g 0   gabapentin (NEURONTIN) 100 MG capsule Take 200 mg by mouth 3 (three) times daily. On the weekend only 3x , 2x daily mon-frid     ibuprofen  (ADVIL ) 800 MG tablet Take 1 tablet (800 mg total) by mouth every 8 (eight) hours as needed. 90 tablet 0   nicotine  (NICOTROL ) 10 MG inhaler Inhale 1 Cartridge (1 continuous puffing total) into the lungs as needed for smoking cessation. 42 each 0   silver  sulfADIAZINE  (SILVADENE ) 1 % cream Apply 1 application topically daily. 50 g 0   Boric Acid Vaginal 600 MG SUPP Place 600 mg vaginally once a week. (Patient not taking: Reported on 04/13/2024) 12 suppository 4   Multiple Vitamins-Minerals (EMERGEN-C IMMUNE PLUS) PACK Take 1 Package by mouth daily.     estradiol  (ESTRACE ) 1 MG tablet Take 1 tablet (1 mg total) by mouth daily. 90 tablet 4   loperamide  (IMODIUM ) 2 MG capsule Take 1 capsule (2 mg total) by mouth 4 (four) times daily as needed for diarrhea or loose stools. 12 capsule 0   metroNIDAZOLE  (FLAGYL ) 500 MG tablet Take 1 tablet (500 mg total) by mouth 2 (two) times daily. 14 tablet 0   naloxone (NARCAN) nasal spray 4 mg/0.1 mL      promethazine -dextromethorphan (PROMETHAZINE -DM) 6.25-15 MG/5ML syrup Take 5 mLs by mouth 4 (four) times daily as needed for cough. 118 mL 0   No facility-administered medications prior to visit.    Allergies  Allergen Reactions   Latex Other (See Comments)    blisters   Tape Rash    Cause a burn    Review of Systems  Constitutional:  Negative for chills and  fever.  Respiratory:  Negative for cough and shortness of breath.   Cardiovascular:  Negative for chest pain and palpitations.  Musculoskeletal:  Positive for arthralgias, back pain and neck pain.  Skin:  Negative for rash.  Neurological:  Positive for numbness (Right hand). Negative for dizziness and weakness.  Psychiatric/Behavioral:  Negative for agitation and behavioral problems.  Objective:    Physical Exam Vitals reviewed.  Constitutional:      General: She is not in acute distress.    Appearance: She is not diaphoretic.  HENT:     Head: Normocephalic and atraumatic.     Nose: Nose normal.     Mouth/Throat:     Mouth: Mucous membranes are moist.  Eyes:     General: No scleral icterus.    Extraocular Movements: Extraocular movements intact.  Cardiovascular:     Rate and Rhythm: Normal rate and regular rhythm.     Heart sounds: Normal heart sounds. No murmur heard. Pulmonary:     Breath sounds: Normal breath sounds. No wheezing or rales.  Musculoskeletal:     Cervical back: Neck supple. Tenderness present. Pain with movement present. Decreased range of motion (Due to pain).     Right lower leg: No edema.     Left lower leg: No edema.  Skin:    General: Skin is warm.     Findings: No rash.  Neurological:     General: No focal deficit present.     Mental Status: She is alert and oriented to person, place, and time.  Psychiatric:        Mood and Affect: Mood normal.        Behavior: Behavior normal.     BP 134/83   Pulse 87   Wt 172 lb 6.4 oz (78.2 kg)   SpO2 98%   BMI 30.06 kg/m  Wt Readings from Last 3 Encounters:  04/13/24 172 lb 6.4 oz (78.2 kg)  01/06/24 174 lb 3.2 oz (79 kg)  12/22/23 149 lb 14.6 oz (68 kg)        Assessment & Plan:   Problem List Items Addressed This Visit       Musculoskeletal and Integument   DDD (degenerative disc disease), cervical - Primary   Patient reports history of DDD of cervical spine Has had x-rays at  [facility] clinic, records currently unavailable for review Used to take chronic opioid medication for chronic neck pain, but has stopped since 09/24 On gabapentin 200 mg 3 times daily, continue for now Continue Flexeril 5 mg BID as needed, does not tolerate 10 mg dose Prednisone 40 mg QD x 5 days Tylenol  arthritis as needed for pain, advised to avoid taking ibuprofen  while taking prednisone Advised to perform simple massage over the trapezius muscle area      Relevant Medications   predniSONE (DELTASONE) 20 MG tablet   Strain of right trapezius muscle   Recent worsening of neck pain likely due to trapezius muscle strain Advised to perform simple massage over the trapezius muscle area Flexeril as needed for muscle spasms Prednisone 40 mg once daily X 5 days      Relevant Medications   predniSONE (DELTASONE) 20 MG tablet     Meds ordered this encounter  Medications   predniSONE (DELTASONE) 20 MG tablet    Sig: Take 2 tablets (40 mg total) by mouth daily with breakfast.    Dispense:  10 tablet    Refill:  0     Isaiyah Feldhaus Alyssa Backbone, MD

## 2024-04-13 NOTE — Assessment & Plan Note (Signed)
 Patient reports history of DDD of cervical spine Has had x-rays at [facility] clinic, records currently unavailable for review Used to take chronic opioid medication for chronic neck pain, but has stopped since 09/24 On gabapentin 200 mg 3 times daily, continue for now Continue Flexeril 5 mg BID as needed, does not tolerate 10 mg dose Prednisone 40 mg QD x 5 days Tylenol  arthritis as needed for pain, advised to avoid taking ibuprofen  while taking prednisone Advised to perform simple massage over the trapezius muscle area

## 2024-04-13 NOTE — Patient Instructions (Signed)
 Please take Prednisone as prescribed. Please do not take Ibuprofen  while taking Prednisone. Okay to take Tylenol  as needed for pain.  Please take Flexeril as needed for muscle spasms.  Please take Gabapentin 200 mg as prescribed for neck pain.

## 2024-04-13 NOTE — Assessment & Plan Note (Signed)
 Recent worsening of neck pain likely due to trapezius muscle strain Advised to perform simple massage over the trapezius muscle area Flexeril as needed for muscle spasms Prednisone 40 mg once daily X 5 days

## 2024-04-28 ENCOUNTER — Encounter (HOSPITAL_COMMUNITY): Payer: Self-pay

## 2024-04-28 ENCOUNTER — Ambulatory Visit (HOSPITAL_COMMUNITY)
Admission: RE | Admit: 2024-04-28 | Discharge: 2024-04-28 | Disposition: A | Discharge status: Home / Self Care | Source: Ambulatory Visit | Attending: Internal Medicine | Admitting: Internal Medicine

## 2024-04-28 DIAGNOSIS — Z1231 Encounter for screening mammogram for malignant neoplasm of breast: Secondary | ICD-10-CM | POA: Insufficient documentation

## 2024-05-10 ENCOUNTER — Encounter: Payer: Self-pay | Admitting: Obstetrics & Gynecology

## 2024-05-10 ENCOUNTER — Ambulatory Visit: Admitting: Obstetrics & Gynecology

## 2024-05-10 ENCOUNTER — Other Ambulatory Visit (HOSPITAL_COMMUNITY)
Admission: RE | Admit: 2024-05-10 | Discharge: 2024-05-10 | Disposition: A | Discharge status: Home / Self Care | Source: Ambulatory Visit | Attending: Obstetrics & Gynecology | Admitting: Obstetrics & Gynecology

## 2024-05-10 ENCOUNTER — Other Ambulatory Visit: Payer: Self-pay | Admitting: Internal Medicine

## 2024-05-10 VITALS — BP 132/89 | HR 97 | Ht 63.0 in | Wt 171.4 lb

## 2024-05-10 DIAGNOSIS — Z01411 Encounter for gynecological examination (general) (routine) with abnormal findings: Secondary | ICD-10-CM | POA: Diagnosis not present

## 2024-05-10 DIAGNOSIS — Z90710 Acquired absence of both cervix and uterus: Secondary | ICD-10-CM

## 2024-05-10 DIAGNOSIS — Z113 Encounter for screening for infections with a predominantly sexual mode of transmission: Secondary | ICD-10-CM | POA: Insufficient documentation

## 2024-05-10 DIAGNOSIS — Z79890 Hormone replacement therapy (postmenopausal): Secondary | ICD-10-CM

## 2024-05-10 DIAGNOSIS — N760 Acute vaginitis: Secondary | ICD-10-CM

## 2024-05-10 MED ORDER — CETIRIZINE HCL 10 MG PO TABS: 10.0000 mg | ORAL_TABLET | Freq: Every day | ORAL | 0 refills | Status: DC

## 2024-05-10 NOTE — Progress Notes (Signed)
 WELL-WOMAN EXAMINATION Patient name: Stacy Holder MRN [id number]  Date of birth: [birth year] Chief Complaint:   Gynecologic Exam  History of Present Illness:   Stacy Holder is a 53 y.o. [id number] PM, PH female being seen today for a routine well-woman exam.   -Recurrent BV Doing a lot better with OTC supplements including Bonafide revaree Plu + pH pineapple gummy  Overall she feels like she is in a much better place than she used to be.  She is continuing with the vaginal estrogen twice weekly and feels like this helps as well.  Denies dyspareunia.  Denies vaginal discharge or odor.  HRT: Her goal was to wean and eventually discontinue this medication.  She has been off her estradiol  for the past month.  She has noted some hot flashes, but states they are overall tolerable.  She wants to give it another 2 months before she changes her plan.  In review patient was on 2 mg then 1mg .   No LMP recorded. Patient has had a hysterectomy.  The current method of family planning is status post hysterectomy.    Last pap NA.  Last mammogram: 04/2024. Last colonoscopy: 2022     01/06/2024    1:12 PM 05/02/2023   10:22 AM 03/27/2023    9:02 AM 01/24/2023   10:42 AM 10/23/2018    9:01 AM  Depression screen PHQ 2/9  Decreased Interest 0 0 0 0 0  Down, Depressed, Hopeless 0 0 0 0 0  PHQ - 2 Score 0 0 0 0 0  Altered sleeping 2 2 2 1    Tired, decreased energy 0 0 3 1   Change in appetite 2 2 0 2   Feeling bad or failure about yourself  0  0 0   Trouble concentrating 0 0 0 0   Moving slowly or fidgety/restless 0 0 0 1   Suicidal thoughts 0 0 0 0   PHQ-9 Score 4 4 5 5    Difficult doing work/chores Not difficult at all          Review of Systems:   Pertinent items are noted in HPI Denies any headaches, blurred vision, fatigue, shortness of breath, chest pain, abdominal pain, bowel movements, urination, or intercourse unless otherwise stated above.  Pertinent History Reviewed:   Reviewed past medical,surgical, social and family history.  Reviewed problem list, medications and allergies. Physical Assessment:   Vitals:   05/10/24 1513  BP: 132/89  Pulse: 97  Weight: 171 lb 6.4 oz (77.7 kg)  Height: 5\' 3"  (1.6 m)  Body mass index is 30.36 kg/m.        Physical Examination:   General appearance - well appearing, and in no distress  Mental status - alert, oriented to person, place, and time  Psych:  She has a normal mood and affect  Skin - warm and dry, normal color, no suspicious lesions noted  Chest - effort normal, all lung fields clear to auscultation bilaterally  Heart - normal rate and regular rhythm  Neck:  midline trachea, no thyromegaly or nodules  Breasts - breasts appear normal, no suspicious masses, no skin or nipple changes or  axillary nodes  Abdomen - soft, nontender, nondistended, no masses or organomegaly  Pelvic - VULVA: normal appearing vulva with no masses, tenderness or lesions  VAGINA: normal appearing vagina with normal color and discharge, no lesions.  Uterus and cervix surgically absent  ADNEXA: No adnexal masses or tenderness noted.  Extremities:  No swelling  or varicosities noted  Chaperone: Lorean Rodes     Assessment & Plan:  1) Well-Woman Exam -Pap no longer indicated - Mammogram up-to-date  2) Recurrent BV Doing well with current supplements, plan for vaginitis panel today to confirm no infection present  3) HRT - Doing well off medication []  May consider restarting 0.5 mg if patient notes worsening of symptoms through the summer  No orders of the defined types were placed in this encounter.   Meds: No orders of the defined types were placed in this encounter.   Follow-up: Return in about 1 year (around 05/10/2025) for Annual.   Rune Mendez, DO Attending Obstetrician & Gynecologist, [facility], [facility]

## 2024-05-10 NOTE — Telephone Encounter (Signed)
 Copied from CRM [id number]. Topic: Clinical - Medication Refill >> May 10, 2024  2:51 PM Stacy Holder E wrote: Medication:  cetirizine  (ZYRTEC ) 10 MG tablet  Has the patient contacted their pharmacy? Yes (Agent: If no, request that the patient contact the pharmacy for the refill. If patient does not wish to contact the pharmacy document the reason why and proceed with request.) (Agent: If yes, when and what did the pharmacy advise?)  This is the patient's preferred pharmacy:  [id number] - [place], [place] - [address] AT [facility] [address]  [place] [address] Phone: [phone] Fax: [phone]  Is this the correct pharmacy for this prescription? Yes If no, delete pharmacy and type the correct one.   Has the prescription been filled recently? Yes  Is the patient out of the medication? Yes  Has the patient been seen for an appointment in the last year OR does the patient have an upcoming appointment? Yes  Can we respond through MyChart? Yes  Agent: Please be advised that Rx refills may take up to 3 business days. We ask that you follow-up with your pharmacy.

## 2024-05-12 LAB — CERVICOVAGINAL ANCILLARY ONLY
Bacterial Vaginitis (gardnerella): NEGATIVE
Candida Glabrata: NEGATIVE
Candida Vaginitis: NEGATIVE
Chlamydia: NEGATIVE
Comment: NEGATIVE
Comment: NEGATIVE
Comment: NEGATIVE
Comment: NEGATIVE
Comment: NEGATIVE
Comment: NORMAL
Neisseria Gonorrhea: NEGATIVE
Trichomonas: NEGATIVE

## 2024-05-13 ENCOUNTER — Ambulatory Visit: Payer: Self-pay | Admitting: Obstetrics & Gynecology

## 2024-06-06 ENCOUNTER — Other Ambulatory Visit: Payer: Self-pay | Admitting: Internal Medicine

## 2024-06-23 DIAGNOSIS — Z8669 Personal history of other diseases of the nervous system and sense organs: Secondary | ICD-10-CM | POA: Diagnosis not present

## 2024-06-23 DIAGNOSIS — H6991 Unspecified Eustachian tube disorder, right ear: Secondary | ICD-10-CM | POA: Diagnosis not present

## 2024-06-23 DIAGNOSIS — J309 Allergic rhinitis, unspecified: Secondary | ICD-10-CM | POA: Diagnosis not present

## 2024-06-23 DIAGNOSIS — H6123 Impacted cerumen, bilateral: Secondary | ICD-10-CM | POA: Diagnosis not present

## 2024-07-01 ENCOUNTER — Ambulatory Visit: Admitting: Internal Medicine

## 2024-07-05 ENCOUNTER — Encounter: Payer: Self-pay | Admitting: Internal Medicine

## 2024-07-06 ENCOUNTER — Ambulatory Visit: Payer: BC Managed Care – PPO | Admitting: Internal Medicine

## 2024-10-11 ENCOUNTER — Encounter: Payer: Self-pay | Admitting: Radiology
